# Patient Record
Sex: Female | Born: 1985 | Race: White | Hispanic: No | Marital: Married | Smoking: Never smoker
Health system: Southern US, Community
[De-identification: ages and names within clinical notes are randomized; demographics above are authoritative.]

## PROBLEM LIST (undated history)

## (undated) DIAGNOSIS — G8929 Other chronic pain: Secondary | ICD-10-CM

## (undated) HISTORY — DX: Other chronic pain: G89.29

## (undated) HISTORY — PX: BACK SURGERY: SHX140

---

## 2011-12-10 ENCOUNTER — Other Ambulatory Visit (HOSPITAL_COMMUNITY)
Admission: RE | Admit: 2011-12-10 | Discharge: 2011-12-10 | Disposition: A | Discharge status: Home / Self Care | Payer: BC Managed Care – PPO | Source: Ambulatory Visit | Attending: Family | Admitting: Family

## 2011-12-10 ENCOUNTER — Encounter: Payer: Self-pay | Admitting: Family

## 2011-12-10 ENCOUNTER — Ambulatory Visit (INDEPENDENT_AMBULATORY_CARE_PROVIDER_SITE_OTHER): Payer: BC Managed Care – PPO | Admitting: Family

## 2011-12-10 DIAGNOSIS — Z01419 Encounter for gynecological examination (general) (routine) without abnormal findings: Secondary | ICD-10-CM | POA: Insufficient documentation

## 2011-12-10 DIAGNOSIS — Z0000 Encounter for general adult medical examination without abnormal findings: Secondary | ICD-10-CM

## 2011-12-10 DIAGNOSIS — Z124 Encounter for screening for malignant neoplasm of cervix: Secondary | ICD-10-CM

## 2011-12-10 DIAGNOSIS — Z3009 Encounter for other general counseling and advice on contraception: Secondary | ICD-10-CM

## 2011-12-10 LAB — LIPID PANEL
HDL: 37.2 mg/dL — ABNORMAL LOW (ref >39.00)
Total CHOL/HDL Ratio: 5
Triglycerides: 65 mg/dL (ref 0.0–149.0)

## 2011-12-10 LAB — CBC
MCHC: 33.6 g/dL (ref 30.0–36.0)
Platelets: 222 10*3/uL (ref 150.0–400.0)
RBC: 4.32 Mil/uL (ref 3.87–5.11)
WBC: 5.4 10*3/uL (ref 4.5–10.5)

## 2011-12-10 LAB — BASIC METABOLIC PANEL
Calcium: 9.1 mg/dL (ref 8.4–10.5)
GFR: 102.63 mL/min (ref >60.00)
Glucose, Bld: 78 mg/dL (ref 70–99)
Sodium: 139 mEq/L (ref 135–145)

## 2011-12-10 MED ORDER — ETONOGESTREL-ETHINYL ESTRADIOL 0.12-0.015 MG/24HR VA RING: VAGINAL_RING | VAGINAL | Status: DC

## 2011-12-10 NOTE — Progress Notes (Signed)
  Subjective:    Patient ID: Natalie [name], female    DOB: [birth year], 26 y.o.   MRN: [id number]  HPI Comments: Routine CPE. Last pap March 2012, normal. Fasting. Requesting labs and pap. This is a routine physical examination for this healthy  Female. Reviewed all health maintenance protocols. Her immunization history was reviewed as well as her current medications and allergies refills of her chronic medications were given and the plan for yearly health maintenance was discussed all orders and referrals were made as appropriate.   Review of Systems  Constitutional: Negative.   HENT: Negative.   Eyes: Negative.   Respiratory: Negative.   Cardiovascular: Negative.   Gastrointestinal: Negative.   Genitourinary: Negative.   Musculoskeletal: Negative.   Skin: Negative.   Neurological: Negative.   Psychiatric/Behavioral: Negative.    No past medical history on file.  History   Social History  . Marital Status: Married    Spouse Name: N/A    Number of Children: N/A  . Years of Education: N/A   Occupational History  . Not on file.   Social History Main Topics  . Smoking status: Never Smoker   . Smokeless tobacco: Not on file  . Alcohol Use: Yes     occassionally  . Drug Use: No  . Sexually Active: Not on file   Other Topics Concern  . Not on file   Social History Narrative  . No narrative on file    No past surgical history on file.  Family History  Problem Relation Age of Onset  . Cancer Maternal Aunt     breast    Not on File  No current outpatient prescriptions on file prior to visit.    BP 106/70  Ht 5' 4.25" (1.632 m)  Wt 171 lb (77.565 kg)  BMI 29.12 kg/m2chart    Objective:   Physical Exam  Constitutional: She is oriented to person, place, and time. She appears well-developed and well-nourished. No distress.  HENT:  Right Ear: External ear normal.  Left Ear: External ear normal.  Nose: Nose normal.  Mouth/Throat: Oropharynx is clear and moist. No  oropharyngeal exudate.  Neck: Normal range of motion. Neck supple. No JVD present. No tracheal deviation present. No thyromegaly present.  Cardiovascular: Normal rate, regular rhythm, normal heart sounds and intact distal pulses.  Exam reveals no gallop and no friction rub.   No murmur heard. Pulmonary/Chest: Effort normal and breath sounds normal. No stridor. No respiratory distress. She has no wheezes. She has no rales. She exhibits no tenderness.  Abdominal: Soft. Bowel sounds are normal. She exhibits no distension and no mass. There is no tenderness. There is no rebound and no guarding.  Genitourinary: Vagina normal and uterus normal. No vaginal discharge found.  Musculoskeletal: Normal range of motion. She exhibits no tenderness.  Neurological: She is alert and oriented to person, place, and time. Coordination normal.  Skin: Skin is warm and dry. No rash noted. She is not diaphoretic. No erythema. No pallor.  Psychiatric: She has a normal mood and affect.          Assessment & Plan:  Assessment: CPE, PAP, Counseling on Birth Control  Plan: Labs: CBC, TSH, BMP. Teaching handout provided for heart healthy diet, safety, exercise Nuva ring

## 2011-12-10 NOTE — Patient Instructions (Signed)
Exercise to Stay Healthy Exercise helps you become and stay healthy. EXERCISE IDEAS AND TIPS Choose exercises that:  You enjoy.   Fit into your day.  You do not need to exercise really hard to be healthy. You can do exercises at a slow or medium level and stay healthy. You can:  Stretch before and after working out.   Try yoga, Pilates, or tai chi.   Lift weights.   Walk fast, swim, jog, run, climb stairs, bicycle, dance, or rollerskate.   Take aerobic classes.  Exercises that burn about 150 calories:  Running 1  miles in 15 minutes.   Playing volleyball for 45 to 60 minutes.   Washing and waxing a car for 45 to 60 minutes.   Playing touch football for 45 minutes.   Walking 1  miles in 35 minutes.   Pushing a stroller 1  miles in 30 minutes.   Playing basketball for 30 minutes.   Raking leaves for 30 minutes.   Bicycling 5 miles in 30 minutes.   Walking 2 miles in 30 minutes.   Dancing for 30 minutes.   Shoveling snow for 15 minutes.   Swimming laps for 20 minutes.   Walking up stairs for 15 minutes.   Bicycling 4 miles in 15 minutes.   Gardening for 30 to 45 minutes.   Jumping rope for 15 minutes.   Washing windows or floors for 45 to 60 minutes.  Document Released: 10/31/2010 Document Revised: 06/10/2011 Document Reviewed: 10/31/2010 [employer] Patient Information 2012 [employer]. 

## 2012-02-25 ENCOUNTER — Encounter: Payer: Self-pay | Admitting: Family

## 2012-02-25 ENCOUNTER — Ambulatory Visit (INDEPENDENT_AMBULATORY_CARE_PROVIDER_SITE_OTHER): Payer: BC Managed Care – PPO | Admitting: Family

## 2012-02-25 VITALS — BP 110/60 | Temp 99.0°F | Wt 171.0 lb

## 2012-02-25 DIAGNOSIS — F411 Generalized anxiety disorder: Secondary | ICD-10-CM

## 2012-02-25 DIAGNOSIS — F419 Anxiety disorder, unspecified: Secondary | ICD-10-CM

## 2012-02-25 DIAGNOSIS — R51 Headache: Secondary | ICD-10-CM

## 2012-02-25 MED ORDER — VENLAFAXINE HCL ER 75 MG PO CP24: 75.0000 mg | ORAL_CAPSULE | Freq: Every day | ORAL | Status: DC

## 2012-02-25 NOTE — Progress Notes (Signed)
  Subjective:    Patient ID: Natalie [name], female    DOB: 1986-08-20, 26 y.o.   MRN: [id number]  HPI 26 year old white female, nonsmoker is in today with complaints of anxiety and headaches. Patient was originally prescribed Effexor 150 mg but has not taken any medication in over a year. She's began to experience some anxiety and headaches. Patient recently relocated here. Her grandfather also recently died. Denies any depression. She denies any feelings of helplessness, hopelessness, both at death or dying.   Review of Systems  Constitutional: Negative.   Respiratory: Negative.   Cardiovascular: Negative.   Gastrointestinal: Negative.   Genitourinary: Negative.   Musculoskeletal: Negative.   Skin: Negative.   Neurological: Negative.   Hematological: Negative.   Psychiatric/Behavioral: Negative.    No past medical history on file.  History   Social History  . Marital Status: Married    Spouse Name: N/A    Number of Children: N/A  . Years of Education: N/A   Occupational History  . Not on file.   Social History Main Topics  . Smoking status: Never Smoker   . Smokeless tobacco: Not on file  . Alcohol Use: Yes     occassionally  . Drug Use: No  . Sexually Active: Not on file   Other Topics Concern  . Not on file   Social History Narrative  . No narrative on file    No past surgical history on file.  Family History  Problem Relation Age of Onset  . Cancer Maternal Aunt     breast    No Known Allergies  Current Outpatient Prescriptions on File Prior to Visit  Medication Sig Dispense Refill  . etonogestrel-ethinyl estradiol (NUVARING) 0.12-0.015 MG/24HR vaginal ring Insert vaginally and leave in place for 3 consecutive weeks, then remove for 1 week.  1 each  12  . venlafaxine XR (EFFEXOR XR) 75 MG 24 hr capsule Take 1 capsule (75 mg total) by mouth daily.  30 capsule  3    BP 110/60  Temp(Src) 99 F (37.2 C) (Oral)  Wt 171 lb (77.565 kg)chart    Objective:     Physical Exam  Constitutional: She is oriented to person, place, and time. She appears well-developed and well-nourished.  Neck: Normal range of motion. Neck supple.  Cardiovascular: Normal rate, regular rhythm and normal heart sounds.   Pulmonary/Chest: Effort normal and breath sounds normal.  Neurological: She is alert and oriented to person, place, and time.  Skin: Skin is warm and dry.  Psychiatric: She has a normal mood and affect.          Assessment & Plan:  Assessment: Anxiety, headaches  Plan: I believe her headaches are anxiety induced. We'll start Effexor XR 75 mg once daily. Will refer back for recheck in 3 weeks. We will consider increasing to 250 mg at her next office visit. Exercise 30 minutes a day.

## 2012-02-25 NOTE — Patient Instructions (Signed)

## 2012-03-29 ENCOUNTER — Ambulatory Visit: Payer: BC Managed Care – PPO | Admitting: Family

## 2012-03-31 ENCOUNTER — Ambulatory Visit: Payer: BC Managed Care – PPO | Admitting: Family

## 2012-04-07 ENCOUNTER — Ambulatory Visit (INDEPENDENT_AMBULATORY_CARE_PROVIDER_SITE_OTHER): Payer: BC Managed Care – PPO | Admitting: Family

## 2012-04-07 ENCOUNTER — Encounter: Payer: Self-pay | Admitting: Family

## 2012-04-07 VITALS — BP 110/72 | Temp 98.7°F | Wt 169.0 lb

## 2012-04-07 DIAGNOSIS — F419 Anxiety disorder, unspecified: Secondary | ICD-10-CM

## 2012-04-07 DIAGNOSIS — F411 Generalized anxiety disorder: Secondary | ICD-10-CM

## 2012-04-07 MED ORDER — VENLAFAXINE HCL ER 75 MG PO CP24: 75.0000 mg | ORAL_CAPSULE | Freq: Every day | ORAL | Status: DC

## 2012-04-07 NOTE — Progress Notes (Signed)
  Subjective:    Patient ID: Natalie [name], female    DOB: Aug 06, 1986, 26 y.o.   MRN: [id number]  HPI 26 year old white female, nonsmoker is in for recheck of anxiety. She's currently taking Effexor 75 mg once daily and tolerating the medication well. Her anxiety level had decreased and she feels less stress. She is adjusting well to [place]. She denies any feelings of helplessness, hopelessness, thoughts of death or dying.   Review of Systems  Constitutional: Negative.   Respiratory: Negative.   Cardiovascular: Negative.   Neurological: Negative.   Psychiatric/Behavioral: Negative.    No past medical history on file.  History   Social History  . Marital Status: Married    Spouse Name: N/A    Number of Children: N/A  . Years of Education: N/A   Occupational History  . Not on file.   Social History Main Topics  . Smoking status: Never Smoker   . Smokeless tobacco: Not on file  . Alcohol Use: Yes     occassionally  . Drug Use: No  . Sexually Active: Not on file   Other Topics Concern  . Not on file   Social History Narrative  . No narrative on file    No past surgical history on file.  Family History  Problem Relation Age of Onset  . Cancer Maternal Aunt     breast    No Known Allergies  Current Outpatient Prescriptions on File Prior to Visit  Medication Sig Dispense Refill  . etonogestrel-ethinyl estradiol (NUVARING) 0.12-0.015 MG/24HR vaginal ring Insert vaginally and leave in place for 3 consecutive weeks, then remove for 1 week.  1 each  12  . DISCONTD: venlafaxine XR (EFFEXOR XR) 75 MG 24 hr capsule Take 1 capsule (75 mg total) by mouth daily.  30 capsule  3    BP 110/72  Temp 98.7 F (37.1 C) (Oral)  Wt 169 lb (76.658 kg)chart    Objective:   Physical Exam  Constitutional: She is oriented to person, place, and time. She appears well-developed and well-nourished.  Neck: Normal range of motion. Neck supple.  Cardiovascular: Normal rate, regular  rhythm and normal heart sounds.   Pulmonary/Chest: Effort normal and breath sounds normal.  Musculoskeletal: Normal range of motion.  Neurological: She is alert and oriented to person, place, and time.  Skin: Skin is warm and dry.  Psychiatric: She has a normal mood and affect.          Assessment & Plan:  Assessment: Anxiety  Plan: Continue Effexor 75 mg once daily. Exercise daily. Recheck in a month for complete physical exam and sooner when necessary. Advise flu shots in late September early October.

## 2012-07-19 ENCOUNTER — Ambulatory Visit: Payer: BC Managed Care – PPO | Admitting: Family

## 2012-07-20 ENCOUNTER — Ambulatory Visit (INDEPENDENT_AMBULATORY_CARE_PROVIDER_SITE_OTHER): Payer: BC Managed Care – PPO | Admitting: Family

## 2012-07-20 ENCOUNTER — Encounter: Payer: Self-pay | Admitting: Family

## 2012-07-20 VITALS — BP 116/82 | HR 112 | Wt 167.0 lb

## 2012-07-20 DIAGNOSIS — F3289 Other specified depressive episodes: Secondary | ICD-10-CM

## 2012-07-20 DIAGNOSIS — F32A Depression, unspecified: Secondary | ICD-10-CM

## 2012-07-20 DIAGNOSIS — M545 Low back pain, unspecified: Secondary | ICD-10-CM

## 2012-07-20 DIAGNOSIS — F329 Major depressive disorder, single episode, unspecified: Secondary | ICD-10-CM

## 2012-07-20 DIAGNOSIS — M79604 Pain in right leg: Secondary | ICD-10-CM

## 2012-07-20 DIAGNOSIS — M79605 Pain in left leg: Secondary | ICD-10-CM

## 2012-07-20 DIAGNOSIS — Z23 Encounter for immunization: Secondary | ICD-10-CM

## 2012-07-20 MED ORDER — KETOROLAC TROMETHAMINE 60 MG/2ML IM SOLN
60.0000 mg | Freq: Once | INTRAMUSCULAR | Status: AC
Start: 2012-07-20 — End: 2012-07-20
  Administered 2012-07-20: 60 mg via INTRAMUSCULAR

## 2012-07-20 MED ORDER — DICLOFENAC SODIUM 75 MG PO TBEC: 75.0000 mg | DELAYED_RELEASE_TABLET | Freq: Two times a day (BID) | ORAL | Status: DC

## 2012-07-20 MED ORDER — CYCLOBENZAPRINE HCL 10 MG PO TABS: 10.0000 mg | ORAL_TABLET | Freq: Three times a day (TID) | ORAL | Status: DC | PRN
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
## 2012-07-20 NOTE — Patient Instructions (Signed)
Sciatica Sciatica is pain, weakness, numbness, or tingling along the path of the sciatic nerve. The nerve starts in the lower back and runs down the back of each leg. The nerve controls the muscles in the lower leg and in the back of the knee, while also providing sensation to the back of the thigh, lower leg, and the sole of your foot. Sciatica is a symptom of another medical condition. For instance, nerve damage or certain conditions, such as a herniated disk or bone spur on the spine, pinch or put pressure on the sciatic nerve. This causes the pain, weakness, or other sensations normally associated with sciatica. Generally, sciatica only affects one side of the body. CAUSES   Herniated or slipped disc.  Degenerative disk disease.  A pain disorder involving the narrow muscle in the buttocks (piriformis syndrome).  Pelvic injury or fracture.  Pregnancy.  Tumor (rare). SYMPTOMS  Symptoms can vary from mild to very severe. The symptoms usually travel from the low back to the buttocks and down the back of the leg. Symptoms can include:  Mild tingling or dull aches in the lower back, leg, or hip.  Numbness in the back of the calf or sole of the foot.  Burning sensations in the lower back, leg, or hip.  Sharp pains in the lower back, leg, or hip.  Leg weakness.  Severe back pain inhibiting movement. These symptoms may get worse with coughing, sneezing, laughing, or prolonged sitting or standing. Also, being overweight may worsen symptoms. DIAGNOSIS  Your caregiver will perform a physical exam to look for common symptoms of sciatica. He or she may ask you to do certain movements or activities that would trigger sciatic nerve pain. Other tests may be performed to find the cause of the sciatica. These may include:  Blood tests.  X-rays.  Imaging tests, such as an MRI or CT scan. TREATMENT  Treatment is directed at the cause of the sciatic pain. Sometimes, treatment is not necessary  and the pain and discomfort goes away on its own. If treatment is needed, your caregiver may suggest:  Over-the-counter medicines to relieve pain.  Prescription medicines, such as anti-inflammatory medicine, muscle relaxants, or narcotics.  Applying heat or ice to the painful area.  Steroid injections to lessen pain, irritation, and inflammation around the nerve.  Reducing activity during periods of pain.  Exercising and stretching to strengthen your abdomen and improve flexibility of your spine. Your caregiver may suggest losing weight if the extra weight makes the back pain worse.  Physical therapy.  Surgery to eliminate what is pressing or pinching the nerve, such as a bone spur or part of a herniated disk. HOME CARE INSTRUCTIONS   Only take over-the-counter or prescription medicines for pain or discomfort as directed by your caregiver.  Apply ice to the affected area for 20 minutes, 3 4 times a day for the first 48 72 hours. Then try heat in the same way.  Exercise, stretch, or perform your usual activities if these do not aggravate your pain.  Attend physical therapy sessions as directed by your caregiver.  Keep all follow-up appointments as directed by your caregiver.  Do not wear high heels or shoes that do not provide proper support.  Check your mattress to see if it is too soft. A firm mattress may lessen your pain and discomfort. SEEK IMMEDIATE MEDICAL CARE IF:   You lose control of your bowel or bladder (incontinence).  You have increasing weakness in the lower back,   pelvis, buttocks, or legs.  You have redness or swelling of your back.  You have a burning sensation when you urinate.  You have pain that gets worse when you lie down or awakens you at night.  Your pain is worse than you have experienced in the past.  Your pain is lasting longer than 4 weeks.  You are suddenly losing weight without reason. MAKE SURE YOU:  Understand these  instructions.  Will watch your condition.  Will get help right away if you are not doing well or get worse. Document Released: 09/22/2001 Document Revised: 03/29/2012 Document Reviewed: 02/07/2012 [employer] Patient Information 2013 [employer].  

## 2012-09-16 ENCOUNTER — Telehealth: Payer: Self-pay | Admitting: Family

## 2012-09-16 NOTE — Telephone Encounter (Signed)
Patient Information:  Caller Name: Yana  Phone: [phone]  Patient: Natalie [name], Natalie [name]  Gender: Female  DOB: 1986-09-21  Age: 26 Years  PCP: Adline Mango [facility])  Pregnant: No   Symptoms  Reason For Call & Symptoms: Seen 07/20/12  for hip pain radiating down the leg.  Told it was sciatica, and given shot in the hip.  Did not improve a lot, and so patient has been seeing the chiropractor, but hip pain has not improved.  Reviewed Health History In EMR: Yes  Reviewed Medications In EMR: Yes  Reviewed Allergies In EMR: Yes  Reviewed Surgeries / Procedures: Yes  Date of Onset of Symptoms: Unknown OB:  LMP: Unknown  Guideline(s) Used:  Back Pain  Disposition Per Guideline:   See Today or Tomorrow in Office  Reason For Disposition Reached:   Pain radiates into the thigh or further down the leg  Advice Given:  N/A  Office Follow Up:  Does the office need to follow up with this patient?: No  Instructions For The Office: N/A  Appointment Scheduled:  09/17/2012 09:00:00 Appointment Scheduled Provider:  Adline Mango (Family Practice)  RN Note:  L hip pain radiating down back of left thigh and down to foot.  Numbness and tingling present as well.  States left calf pain is severe at times.  Treated with injection in the hip and muscle relaxants with no change in pain.  Seen by chiropractor x 2 months with no change in pain as well.  Per protocol, advised appt within 24 hours; appt scheduled 09/17/12 0900 with Ms. Orvan Falconer at [place] office.  krs/can

## 2012-09-17 ENCOUNTER — Ambulatory Visit (INDEPENDENT_AMBULATORY_CARE_PROVIDER_SITE_OTHER): Payer: BC Managed Care – PPO | Admitting: Family

## 2012-09-17 ENCOUNTER — Encounter: Payer: Self-pay | Admitting: Family

## 2012-09-17 VITALS — BP 120/72 | HR 90 | Temp 97.1°F | Resp 16 | Wt 166.2 lb

## 2012-09-17 DIAGNOSIS — IMO0002 Reserved for concepts with insufficient information to code with codable children: Secondary | ICD-10-CM

## 2012-09-17 DIAGNOSIS — M545 Low back pain, unspecified: Secondary | ICD-10-CM

## 2012-09-17 DIAGNOSIS — M5416 Radiculopathy, lumbar region: Secondary | ICD-10-CM

## 2012-09-17 MED ORDER — HYDROCODONE-ACETAMINOPHEN 5-500 MG PO TABS: 1.0000 | ORAL_TABLET | Freq: Three times a day (TID) | ORAL | Status: DC | PRN

## 2012-09-17 MED ORDER — PREDNISONE 20 MG PO TABS: ORAL_TABLET | ORAL | Status: DC

## 2012-09-17 NOTE — Patient Instructions (Signed)
Sciatica Sciatica is pain, weakness, numbness, or tingling along the path of the sciatic nerve. The nerve starts in the lower back and runs down the back of each leg. The nerve controls the muscles in the lower leg and in the back of the knee, while also providing sensation to the back of the thigh, lower leg, and the sole of your foot. Sciatica is a symptom of another medical condition. For instance, nerve damage or certain conditions, such as a herniated disk or bone spur on the spine, pinch or put pressure on the sciatic nerve. This causes the pain, weakness, or other sensations normally associated with sciatica. Generally, sciatica only affects one side of the body. CAUSES   Herniated or slipped disc.  Degenerative disk disease.  A pain disorder involving the narrow muscle in the buttocks (piriformis syndrome).  Pelvic injury or fracture.  Pregnancy.  Tumor (rare). SYMPTOMS  Symptoms can vary from mild to very severe. The symptoms usually travel from the low back to the buttocks and down the back of the leg. Symptoms can include:  Mild tingling or dull aches in the lower back, leg, or hip.  Numbness in the back of the calf or sole of the foot.  Burning sensations in the lower back, leg, or hip.  Sharp pains in the lower back, leg, or hip.  Leg weakness.  Severe back pain inhibiting movement. These symptoms may get worse with coughing, sneezing, laughing, or prolonged sitting or standing. Also, being overweight may worsen symptoms. DIAGNOSIS  Your caregiver will perform a physical exam to look for common symptoms of sciatica. He or she may ask you to do certain movements or activities that would trigger sciatic nerve pain. Other tests may be performed to find the cause of the sciatica. These may include:  Blood tests.  X-rays.  Imaging tests, such as an MRI or CT scan. TREATMENT  Treatment is directed at the cause of the sciatic pain. Sometimes, treatment is not necessary  and the pain and discomfort goes away on its own. If treatment is needed, your caregiver may suggest:  Over-the-counter medicines to relieve pain.  Prescription medicines, such as anti-inflammatory medicine, muscle relaxants, or narcotics.  Applying heat or ice to the painful area.  Steroid injections to lessen pain, irritation, and inflammation around the nerve.  Reducing activity during periods of pain.  Exercising and stretching to strengthen your abdomen and improve flexibility of your spine. Your caregiver may suggest losing weight if the extra weight makes the back pain worse.  Physical therapy.  Surgery to eliminate what is pressing or pinching the nerve, such as a bone spur or part of a herniated disk. HOME CARE INSTRUCTIONS   Only take over-the-counter or prescription medicines for pain or discomfort as directed by your caregiver.  Apply ice to the affected area for 20 minutes, 3 4 times a day for the first 48 72 hours. Then try heat in the same way.  Exercise, stretch, or perform your usual activities if these do not aggravate your pain.  Attend physical therapy sessions as directed by your caregiver.  Keep all follow-up appointments as directed by your caregiver.  Do not wear high heels or shoes that do not provide proper support.  Check your mattress to see if it is too soft. A firm mattress may lessen your pain and discomfort. SEEK IMMEDIATE MEDICAL CARE IF:   You lose control of your bowel or bladder (incontinence).  You have increasing weakness in the lower back,   pelvis, buttocks, or legs.  You have redness or swelling of your back.  You have a burning sensation when you urinate.  You have pain that gets worse when you lie down or awakens you at night.  Your pain is worse than you have experienced in the past.  Your pain is lasting longer than 4 weeks.  You are suddenly losing weight without reason. MAKE SURE YOU:  Understand these  instructions.  Will watch your condition.  Will get help right away if you are not doing well or get worse. Document Released: 09/22/2001 Document Revised: 03/29/2012 Document Reviewed: 02/07/2012 [employer] Patient Information 2013 [employer].  

## 2012-09-17 NOTE — Progress Notes (Signed)
Subjective:    Patient ID: Natalie [name], female    DOB: [birth year], 26 y.o.   MRN: [id number]  HPI 26 year old white female, nonsmoker, is in with persistent low back pain that radiates down her left leg. She has been on anti-inflammatory medications, muscle relaxers, seen a chiropractor over the last several months that has not helped. Rates the pain 5/10 and worsening. Pain is worse with walking and with movement. Ice has helped some. Denies any pmhx of back injury.    Review of Systems  Constitutional: Negative.   Respiratory: Negative.   Cardiovascular: Negative.   Gastrointestinal: Negative.   Musculoskeletal: Positive for back pain.       Radiates down the leg into the shin area  Neurological: Negative.  Negative for weakness and numbness.  Hematological: Negative.   Psychiatric/Behavioral: Negative.    No past medical history on file.  History   Social History  . Marital Status: Married    Spouse Name: N/A    Number of Children: N/A  . Years of Education: N/A   Occupational History  . Not on file.   Social History Main Topics  . Smoking status: Never Smoker   . Smokeless tobacco: Not on file  . Alcohol Use: Yes     Comment: occassionally  . Drug Use: No  . Sexually Active: Not on file   Other Topics Concern  . Not on file   Social History Narrative  . No narrative on file    No past surgical history on file.  Family History  Problem Relation Age of Onset  . Cancer Maternal Aunt     breast    No Known Allergies  Current Outpatient Prescriptions on File Prior to Visit  Medication Sig Dispense Refill  . cyclobenzaprine (FLEXERIL) 10 MG tablet Take 1 tablet (10 mg total) by mouth 3 (three) times daily as needed for muscle spasms.  30 tablet  0  . diclofenac (VOLTAREN) 75 MG EC tablet Take 1 tablet (75 mg total) by mouth 2 (two) times daily.  30 tablet  0  . etonogestrel-ethinyl estradiol (NUVARING) 0.12-0.015 MG/24HR vaginal ring Insert vaginally and  leave in place for 3 consecutive weeks, then remove for 1 week.  1 each  12  . venlafaxine XR (EFFEXOR XR) 75 MG 24 hr capsule Take 1 capsule (75 mg total) by mouth daily.  30 capsule  5    BP 120/72  Pulse 90  Temp 97.1 F (36.2 C) (Oral)  Resp 16  Wt 166 lb 4 oz (75.411 kg)chart    Objective:   Physical Exam  Constitutional: She is oriented to person, place, and time. She appears well-developed and well-nourished.  Neck: Normal range of motion. Neck supple.  Cardiovascular: Normal rate, regular rhythm and normal heart sounds.   Pulmonary/Chest: Effort normal and breath sounds normal.  Abdominal: Soft. Bowel sounds are normal.  Musculoskeletal: She exhibits no edema.       Pain wit flexion and extension at the hip. Has about a 45 degree hip flexion and pain is experienced. Positive SLR on the left. No tenderness to palpation of the spinosis process.   Neurological: She is alert and oriented to person, place, and time. She has normal reflexes. She displays normal reflexes. No cranial nerve deficit. She exhibits normal muscle tone. Coordination normal.  Skin: Skin is dry.  Psychiatric: She has a normal mood and affect.          Assessment & Plan:  Assessment: Lumbar  Radiculopathy, Low Back Pain  Plan: Prednisone 60mg  x 3, 40mg  x 3, 20mg  x 3. Vicodin as needed for pain. MRI of the lspine and possible referral to neuro-surg for further management. Patient to call the office if symptoms worsen or persist. Recheck as scheduled and sooner as needed.

## 2012-09-19 NOTE — Telephone Encounter (Signed)
No follow up required, closing encounter. °

## 2012-09-25 ENCOUNTER — Ambulatory Visit
Admission: RE | Admit: 2012-09-25 | Discharge: 2012-09-25 | Disposition: A | Discharge status: Home / Self Care | Payer: BC Managed Care – PPO | Source: Ambulatory Visit | Attending: Family | Admitting: Family

## 2012-09-25 DIAGNOSIS — M545 Low back pain, unspecified: Secondary | ICD-10-CM

## 2012-09-25 DIAGNOSIS — M5416 Radiculopathy, lumbar region: Secondary | ICD-10-CM

## 2012-09-26 ENCOUNTER — Other Ambulatory Visit: Payer: Self-pay | Admitting: Family

## 2012-09-26 DIAGNOSIS — M5136 Other intervertebral disc degeneration, lumbar region: Secondary | ICD-10-CM

## 2012-09-26 DIAGNOSIS — M5126 Other intervertebral disc displacement, lumbar region: Secondary | ICD-10-CM

## 2012-09-28 ENCOUNTER — Ambulatory Visit: Payer: BC Managed Care – PPO | Admitting: Family

## 2012-10-04 ENCOUNTER — Other Ambulatory Visit: Payer: Self-pay | Admitting: Neurosurgery

## 2012-10-04 DIAGNOSIS — M549 Dorsalgia, unspecified: Secondary | ICD-10-CM

## 2012-10-11 ENCOUNTER — Ambulatory Visit
Admission: RE | Admit: 2012-10-11 | Discharge: 2012-10-11 | Disposition: A | Discharge status: Home / Self Care | Payer: BC Managed Care – PPO | Source: Ambulatory Visit | Attending: Neurosurgery | Admitting: Neurosurgery

## 2012-10-11 DIAGNOSIS — M549 Dorsalgia, unspecified: Secondary | ICD-10-CM

## 2012-10-11 MED ORDER — IOHEXOL 180 MG/ML  SOLN
1.0000 mL | Freq: Once | INTRAMUSCULAR | Status: AC | PRN
  Administered 2012-10-11: 1 mL via EPIDURAL

## 2012-10-11 MED ORDER — METHYLPREDNISOLONE ACETATE 40 MG/ML INJ SUSP (RADIOLOG
120.0000 mg | Freq: Once | INTRAMUSCULAR | Status: AC
  Administered 2012-10-11: 120 mg via EPIDURAL

## 2012-10-19 ENCOUNTER — Other Ambulatory Visit: Payer: Self-pay

## 2012-10-19 MED ORDER — ETONOGESTREL-ETHINYL ESTRADIOL 0.12-0.015 MG/24HR VA RING: VAGINAL_RING | VAGINAL | Status: DC

## 2012-10-28 ENCOUNTER — Other Ambulatory Visit: Payer: Self-pay

## 2012-10-28 MED ORDER — VENLAFAXINE HCL ER 75 MG PO CP24: 75.0000 mg | ORAL_CAPSULE | Freq: Every day | ORAL | Status: DC

## 2012-12-05 ENCOUNTER — Ambulatory Visit (INDEPENDENT_AMBULATORY_CARE_PROVIDER_SITE_OTHER): Payer: PRIVATE HEALTH INSURANCE | Admitting: Family Medicine

## 2012-12-05 ENCOUNTER — Other Ambulatory Visit (INDEPENDENT_AMBULATORY_CARE_PROVIDER_SITE_OTHER): Payer: PRIVATE HEALTH INSURANCE

## 2012-12-05 ENCOUNTER — Encounter: Payer: Self-pay | Admitting: Family Medicine

## 2012-12-05 VITALS — BP 104/68 | HR 131 | Temp 98.3°F | Wt 170.0 lb

## 2012-12-05 DIAGNOSIS — J069 Acute upper respiratory infection, unspecified: Secondary | ICD-10-CM

## 2012-12-05 DIAGNOSIS — J209 Acute bronchitis, unspecified: Secondary | ICD-10-CM

## 2012-12-05 DIAGNOSIS — Z0000 Encounter for general adult medical examination without abnormal findings: Secondary | ICD-10-CM

## 2012-12-05 LAB — HEPATIC FUNCTION PANEL: Total Bilirubin: 0.2 mg/dL — ABNORMAL LOW (ref 0.3–1.2)

## 2012-12-05 LAB — CBC WITH DIFFERENTIAL/PLATELET
Basophils Absolute: 0 10*3/uL (ref 0.0–0.1)
HCT: 36.2 % (ref 36.0–46.0)
Lymphs Abs: 1.6 10*3/uL (ref 0.7–4.0)
MCV: 93.5 fl (ref 78.0–100.0)
Monocytes Absolute: 0.6 10*3/uL (ref 0.1–1.0)
Monocytes Relative: 6.3 % (ref 3.0–12.0)
Platelets: 271 10*3/uL (ref 150.0–400.0)
RDW: 12.3 % (ref 11.5–14.6)

## 2012-12-05 LAB — BASIC METABOLIC PANEL
BUN: 8 mg/dL (ref 6–23)
GFR: 92.91 mL/min (ref >60.00)
Glucose, Bld: 83 mg/dL (ref 70–99)
Potassium: 3.7 mEq/L (ref 3.5–5.1)

## 2012-12-05 LAB — LIPID PANEL
HDL: 38.3 mg/dL — ABNORMAL LOW (ref >39.00)
Triglycerides: 98 mg/dL (ref 0.0–149.0)

## 2012-12-05 LAB — TSH: TSH: 1.45 u[IU]/mL (ref 0.35–5.50)

## 2012-12-05 MED ORDER — BENZONATATE 100 MG PO CAPS: 100.0000 mg | ORAL_CAPSULE | Freq: Three times a day (TID) | ORAL | Status: DC | PRN

## 2012-12-05 MED ORDER — AZITHROMYCIN 250 MG PO TABS: ORAL_TABLET | ORAL | Status: DC

## 2012-12-05 MED ORDER — ALBUTEROL SULFATE HFA 108 (90 BASE) MCG/ACT IN AERS: 2.0000 | INHALATION_SPRAY | Freq: Four times a day (QID) | RESPIRATORY_TRACT | Status: DC | PRN

## 2012-12-05 NOTE — Progress Notes (Signed)
Chief Complaint  Patient presents with  . back surgery x 3 weeks ago  . deep cough    hurts patient to couh; terrible pain     HPI:  Acute visit for cough: -started about 1 week ago -symptoms: productive cough, congestion, wheezing a little -denies: fevers, NVD, SOB, no exposure to flu -she had back surgery for a disc problem 3 days ago and cough is aggravating the post surgical back pain -no sick contacts  ROS: See pertinent positives and negatives per HPI.  No past medical history on file.  Family History  Problem Relation Age of Onset  . Cancer Maternal Aunt     breast    History   Social History  . Marital Status: Married    Spouse Name: N/A    Number of Children: N/A  . Years of Education: N/A   Social History Main Topics  . Smoking status: Never Smoker   . Smokeless tobacco: None  . Alcohol Use: Yes     Comment: occassionally  . Drug Use: No  . Sexually Active: None   Other Topics Concern  . None   Social History Narrative  . None    Current outpatient prescriptions:diazepam (VALIUM) 5 MG tablet, Take 5 mg by mouth every 6 (six) hours as needed., Disp: , Rfl: ;  etonogestrel-ethinyl estradiol (NUVARING) 0.12-0.015 MG/24HR vaginal ring, Insert vaginally and leave in place for 3 consecutive weeks, then remove for 1 week., Disp: 1 each, Rfl: 12 oxyCODONE (ROXICODONE) 15 MG immediate release tablet, Take 15 mg by mouth. Take 1/2 to 1 tablet by mouth every 4 to 6 hours as needed for pain., Disp: , Rfl: ;  venlafaxine XR (EFFEXOR XR) 75 MG 24 hr capsule, Take 1 capsule (75 mg total) by mouth daily., Disp: 30 capsule, Rfl: 5;  albuterol (PROVENTIL HFA;VENTOLIN HFA) 108 (90 BASE) MCG/ACT inhaler, Inhale 2 puffs into the lungs every 6 (six) hours as needed for wheezing., Disp: 1 Inhaler, Rfl: 0 azithromycin (ZITHROMAX) 250 MG tablet, 2 tabs on the first day then one tab daily, Disp: 6 tablet, Rfl: 0;  benzonatate (TESSALON PERLES) 100 MG capsule, Take 1 capsule (100 mg  total) by mouth 3 (three) times daily as needed for cough., Disp: 20 capsule, Rfl: 0;  cyclobenzaprine (FLEXERIL) 10 MG tablet, Take 1 tablet (10 mg total) by mouth 3 (three) times daily as needed for muscle spasms., Disp: 30 tablet, Rfl: 0 diclofenac (VOLTAREN) 75 MG EC tablet, Take 1 tablet (75 mg total) by mouth 2 (two) times daily., Disp: 30 tablet, Rfl: 0;  HYDROcodone-acetaminophen (VICODIN) 5-500 MG per tablet, Take 1 tablet by mouth every 8 (eight) hours as needed for pain., Disp: 20 tablet, Rfl: 0;  predniSONE (DELTASONE) 20 MG tablet, 60mg  PO qam x 3 days, 40mg  po qam x 3 days, 20mg  qam x 3 days, Disp: 18 tablet, Rfl: 0  EXAM:  Filed Vitals:   12/05/12 0843  BP: 104/68  Pulse: 131  Temp: 98.3 F (36.8 C)    Body mass index is 28.95 kg/(m^2).  GENERAL: vitals reviewed and listed above, alert, oriented, appears well hydrated and in no acute distress  HEENT: atraumatic, conjunttiva clear, no obvious abnormalities on inspection of external nose and ears, normal appearance of ear canals and TMs, clear nasal congestion, mild post oropharyngeal erythema with PND, no tonsillar edema or exudate, no sinus TTP  NECK: no obvious masses on inspection  LUNGS: few scattered wheezes, no rhonchi  CV: HRRR, no peripheral edema  MS: moves all extremities without noticeable abnormality  PSYCH: pleasant and cooperative, no obvious depression or anxiety  ASSESSMENT AND PLAN:  Discussed the following assessment and plan:  Acute bronchitis - Plan: azithromycin (ZITHROMAX) 250 MG tablet, albuterol (PROVENTIL HFA;VENTOLIN HFA) 108 (90 BASE) MCG/ACT inhaler, benzonatate (TESSALON PERLES) 100 MG capsule  Acute upper respiratory infections of unspecified site - Plan: albuterol (PROVENTIL HFA;VENTOLIN HFA) 108 (90 BASE) MCG/ACT inhaler, benzonatate (TESSALON PERLES) 100 MG capsule  -likely viral, but given exam findings, getting worse over one week will tx with abx, alb and cough medication. She is  taking oxycodone from her [facility] for pain. Advised if continued or worsening back pain should see her neurosurgeon. Has follow up with PCP next week. -Patient advised to return or notify a doctor immediately if symptoms worsen or persist or new concerns arise.  Patient Instructions  -As we discussed, we have prescribed a new medication (Azithromycin, tessalon perles and albuterol) for you at this appointment. We discussed the common and serious potential adverse effects of this medication and you can review these and more with the pharmacist when you pick up your medication.  Please follow the instructions for use carefully and notify us immediately if you have any problems taking this medication.  -humidifier at night  -follow up with your neurosurgeon if continued back pain  -follow with your doctor next week or sooner if other concerns or worsening        Zebadiah Willert R.

## 2012-12-05 NOTE — Addendum Note (Signed)
Addended by: Bonnye Fava on: 12/05/2012 08:37 AM   Modules accepted: Orders

## 2012-12-05 NOTE — Patient Instructions (Addendum)
-  As we discussed, we have prescribed a new medication (Azithromycin, tessalon perles and albuterol) for you at this appointment. We discussed the common and serious potential adverse effects of this medication and you can review these and more with the pharmacist when you pick up your medication.  Please follow the instructions for use carefully and notify us immediately if you have any problems taking this medication.  -humidifier at night  -follow up with your neurosurgeon if continued back pain  -follow with your doctor next week or sooner if other concerns or worsening

## 2012-12-09 ENCOUNTER — Other Ambulatory Visit (HOSPITAL_COMMUNITY): Payer: Self-pay | Admitting: Neurosurgery

## 2012-12-09 ENCOUNTER — Ambulatory Visit (HOSPITAL_COMMUNITY)
Admission: RE | Admit: 2012-12-09 | Discharge: 2012-12-09 | Disposition: A | Discharge status: Home / Self Care | Payer: PRIVATE HEALTH INSURANCE | Source: Ambulatory Visit | Attending: Neurosurgery | Admitting: Neurosurgery

## 2012-12-09 DIAGNOSIS — M545 Low back pain, unspecified: Secondary | ICD-10-CM | POA: Insufficient documentation

## 2012-12-09 DIAGNOSIS — M5126 Other intervertebral disc displacement, lumbar region: Secondary | ICD-10-CM | POA: Insufficient documentation

## 2012-12-09 DIAGNOSIS — M549 Dorsalgia, unspecified: Secondary | ICD-10-CM

## 2012-12-09 DIAGNOSIS — M79609 Pain in unspecified limb: Secondary | ICD-10-CM | POA: Insufficient documentation

## 2012-12-09 MED ORDER — GADOBENATE DIMEGLUMINE 529 MG/ML IV SOLN
15.0000 mL | Freq: Once | INTRAVENOUS | Status: AC | PRN
  Administered 2012-12-09: 15 mL via INTRAVENOUS

## 2012-12-12 ENCOUNTER — Encounter: Payer: Self-pay | Admitting: Family

## 2012-12-12 ENCOUNTER — Other Ambulatory Visit: Payer: Self-pay | Admitting: Neurosurgery

## 2012-12-12 ENCOUNTER — Ambulatory Visit (INDEPENDENT_AMBULATORY_CARE_PROVIDER_SITE_OTHER): Payer: PRIVATE HEALTH INSURANCE | Admitting: Family

## 2012-12-12 ENCOUNTER — Ambulatory Visit
Admission: RE | Admit: 2012-12-12 | Discharge: 2012-12-12 | Disposition: A | Discharge status: Home / Self Care | Payer: PRIVATE HEALTH INSURANCE | Source: Ambulatory Visit | Attending: Neurosurgery | Admitting: Neurosurgery

## 2012-12-12 VITALS — BP 112/74 | Wt 170.0 lb

## 2012-12-12 DIAGNOSIS — M549 Dorsalgia, unspecified: Secondary | ICD-10-CM

## 2012-12-12 DIAGNOSIS — R062 Wheezing: Secondary | ICD-10-CM

## 2012-12-12 DIAGNOSIS — J209 Acute bronchitis, unspecified: Secondary | ICD-10-CM

## 2012-12-12 DIAGNOSIS — Z0000 Encounter for general adult medical examination without abnormal findings: Secondary | ICD-10-CM

## 2012-12-12 LAB — POCT URINALYSIS DIPSTICK
Bilirubin, UA: NEGATIVE
Glucose, UA: NEGATIVE
Nitrite, UA: NEGATIVE

## 2012-12-12 MED ORDER — IOHEXOL 180 MG/ML  SOLN
1.0000 mL | Freq: Once | INTRAMUSCULAR | Status: AC | PRN
  Administered 2012-12-12: 1 mL via EPIDURAL

## 2012-12-12 MED ORDER — METHYLPREDNISOLONE ACETATE 40 MG/ML INJ SUSP (RADIOLOG
120.0000 mg | Freq: Once | INTRAMUSCULAR | Status: AC
  Administered 2012-12-12: 120 mg via EPIDURAL

## 2012-12-12 MED ORDER — PREDNISONE 20 MG PO TABS: ORAL_TABLET | ORAL | Status: AC

## 2012-12-12 NOTE — Patient Instructions (Addendum)

## 2012-12-12 NOTE — Progress Notes (Signed)
Subjective:    Patient ID: Natalie [name], female    DOB: Nov 10, 1985, 27 y.o.   MRN: [id number]  HPI 7 -year-old, nonsmoker is in today for complete physical exam. She is declining Pap smear today due to 2 persistent back pain. Patient is status post laminectomy in January and has been having complications since that point. Reports initially feeling well then about 2 weeks postop, she began to have extreme low back pain that she's been medicated with hydrocodone. She had an MRI ordered by neurosurgery on Friday. She has an appointment here today.  Patient continues to complain of cough and chest congestion that's been ongoing x2 weeks. She saw Dr. Selena Batten last week who prescribed a Z-Pak and an inhaler. Patient symptoms have not improved. Now has wheezing   Review of Systems  Constitutional: Negative.   HENT: Negative.   Eyes: Negative.   Respiratory: Positive for cough and wheezing. Negative for shortness of breath.   Cardiovascular: Negative.   Gastrointestinal: Negative.   Endocrine: Negative.   Genitourinary: Negative.   Musculoskeletal: Positive for back pain.  Skin: Negative.   Allergic/Immunologic: Negative.   Neurological: Negative.   Hematological: Negative.   Psychiatric/Behavioral: Negative.    History reviewed. No pertinent past medical history.  History   Social History  . Marital Status: Married    Spouse Name: N/A    Number of Children: N/A  . Years of Education: N/A   Occupational History  . Not on file.   Social History Main Topics  . Smoking status: Never Smoker   . Smokeless tobacco: Not on file  . Alcohol Use: Yes     Comment: occassionally  . Drug Use: No  . Sexually Active: Not on file   Other Topics Concern  . Not on file   Social History Narrative  . No narrative on file    History reviewed. No pertinent past surgical history.  Family History  Problem Relation Age of Onset  . Cancer Maternal Aunt     breast    No Known  Allergies  Current Outpatient Prescriptions on File Prior to Visit  Medication Sig Dispense Refill  . albuterol (PROVENTIL HFA;VENTOLIN HFA) 108 (90 BASE) MCG/ACT inhaler Inhale 2 puffs into the lungs every 6 (six) hours as needed for wheezing.  1 Inhaler  0  . azithromycin (ZITHROMAX) 250 MG tablet 2 tabs on the first day then one tab daily  6 tablet  0  . benzonatate (TESSALON PERLES) 100 MG capsule Take 1 capsule (100 mg total) by mouth 3 (three) times daily as needed for cough.  20 capsule  0  . cyclobenzaprine (FLEXERIL) 10 MG tablet Take 1 tablet (10 mg total) by mouth 3 (three) times daily as needed for muscle spasms.  30 tablet  0  . diazepam (VALIUM) 5 MG tablet Take 5 mg by mouth every 6 (six) hours as needed.      . diclofenac (VOLTAREN) 75 MG EC tablet Take 1 tablet (75 mg total) by mouth 2 (two) times daily.  30 tablet  0  . etonogestrel-ethinyl estradiol (NUVARING) 0.12-0.015 MG/24HR vaginal ring Insert vaginally and leave in place for 3 consecutive weeks, then remove for 1 week.  1 each  12  . HYDROcodone-acetaminophen (VICODIN) 5-500 MG per tablet Take 1 tablet by mouth every 8 (eight) hours as needed for pain.  20 tablet  0  . oxyCODONE (ROXICODONE) 15 MG immediate release tablet Take 15 mg by mouth. Take 1/2 to 1 tablet by  mouth every 4 to 6 hours as needed for pain.      Marland Kitchen venlafaxine XR (EFFEXOR XR) 75 MG 24 hr capsule Take 1 capsule (75 mg total) by mouth daily.  30 capsule  5   No current facility-administered medications on file prior to visit.    BP 112/74  Wt 170 lb (77.111 kg)  BMI 28.95 kg/m2  SpO2 97%  LMP 12/10/2013chart    Objective:   Physical Exam  Constitutional: She is oriented to person, place, and time. She appears well-developed and well-nourished.  HENT:  Right Ear: External ear normal.  Left Ear: External ear normal.  Nose: Nose normal.  Mouth/Throat: Oropharynx is clear and moist.  Eyes: Conjunctivae are normal. Pupils are equal, round, and  reactive to light.  Neck: Normal range of motion. Neck supple.  Cardiovascular: Normal rate, regular rhythm and normal heart sounds.   Pulmonary/Chest: Effort normal. She has wheezes.  Abdominal: Soft. Bowel sounds are normal. She exhibits no distension. There is no tenderness. There is no rebound.  Musculoskeletal: Normal range of motion.  Neurological: She is alert and oriented to person, place, and time. She has normal reflexes. No cranial nerve deficit.  Skin: Skin is warm and dry.  Psychiatric: She has a normal mood and affect.          Assessment & Plan:  Assessment:  1. Complete physical exam 2. Low back pain 3. Acute bronchitis  Plan: See neurosurgery as scheduled. Prednisone 60 x 3, 40 x 3, 20 x 3 for bronchitis. Labs discussed. Schedule pap smear when back pain is improved.

## 2012-12-28 ENCOUNTER — Other Ambulatory Visit (HOSPITAL_COMMUNITY)
Admission: RE | Admit: 2012-12-28 | Discharge: 2012-12-28 | Disposition: A | Discharge status: Home / Self Care | Payer: PRIVATE HEALTH INSURANCE | Source: Ambulatory Visit | Attending: Family | Admitting: Family

## 2012-12-28 ENCOUNTER — Ambulatory Visit (INDEPENDENT_AMBULATORY_CARE_PROVIDER_SITE_OTHER): Payer: PRIVATE HEALTH INSURANCE | Admitting: Family

## 2012-12-28 ENCOUNTER — Encounter: Payer: Self-pay | Admitting: Family

## 2012-12-28 VITALS — BP 100/62 | HR 122 | Wt 170.0 lb

## 2012-12-28 DIAGNOSIS — Z124 Encounter for screening for malignant neoplasm of cervix: Secondary | ICD-10-CM

## 2012-12-28 DIAGNOSIS — G8929 Other chronic pain: Secondary | ICD-10-CM

## 2012-12-28 DIAGNOSIS — M549 Dorsalgia, unspecified: Secondary | ICD-10-CM

## 2012-12-28 DIAGNOSIS — Z01419 Encounter for gynecological examination (general) (routine) without abnormal findings: Secondary | ICD-10-CM | POA: Insufficient documentation

## 2012-12-28 NOTE — Patient Instructions (Addendum)

## 2012-12-28 NOTE — Progress Notes (Signed)
Subjective:    Patient ID: Natalie [name], female    DOB: 1986/10/11, 27 y.o.   MRN: [id number]  HPI 27 year old WF, nonsmoker, is in today for a pap smear. She denies any concerns. She continues to see neurology for management and doing well.     Review of Systems  Constitutional: Negative.   Eyes: Negative.   Respiratory: Negative.   Cardiovascular: Negative.   Gastrointestinal: Negative.   Endocrine: Negative.   Musculoskeletal: Positive for back pain. Negative for myalgias, joint swelling and arthralgias.  Skin: Negative.   Neurological: Negative.  Negative for light-headedness.  Hematological: Negative.   Psychiatric/Behavioral: Negative.    No past medical history on file.  History   Social History  . Marital Status: Married    Spouse Name: N/A    Number of Children: N/A  . Years of Education: N/A   Occupational History  . Not on file.   Social History Main Topics  . Smoking status: Never Smoker   . Smokeless tobacco: Not on file  . Alcohol Use: Yes     Comment: occassionally  . Drug Use: No  . Sexually Active: Not on file   Other Topics Concern  . Not on file   Social History Narrative  . No narrative on file    No past surgical history on file.  Family History  Problem Relation Age of Onset  . Cancer Maternal Aunt     breast    No Known Allergies  Current Outpatient Prescriptions on File Prior to Visit  Medication Sig Dispense Refill  . albuterol (PROVENTIL HFA;VENTOLIN HFA) 108 (90 BASE) MCG/ACT inhaler Inhale 2 puffs into the lungs every 6 (six) hours as needed for wheezing.  1 Inhaler  0  . azithromycin (ZITHROMAX) 250 MG tablet 2 tabs on the first day then one tab daily  6 tablet  0  . benzonatate (TESSALON PERLES) 100 MG capsule Take 1 capsule (100 mg total) by mouth 3 (three) times daily as needed for cough.  20 capsule  0  . cyclobenzaprine (FLEXERIL) 10 MG tablet Take 1 tablet (10 mg total) by mouth 3 (three) times daily as needed for  muscle spasms.  30 tablet  0  . diclofenac (VOLTAREN) 75 MG EC tablet Take 1 tablet (75 mg total) by mouth 2 (two) times daily.  30 tablet  0  . etonogestrel-ethinyl estradiol (NUVARING) 0.12-0.015 MG/24HR vaginal ring Insert vaginally and leave in place for 3 consecutive weeks, then remove for 1 week.  1 each  12  . HYDROcodone-acetaminophen (VICODIN) 5-500 MG per tablet Take 1 tablet by mouth every 8 (eight) hours as needed for pain.  20 tablet  0  . venlafaxine XR (EFFEXOR XR) 75 MG 24 hr capsule Take 1 capsule (75 mg total) by mouth daily.  30 capsule  5  . diazepam (VALIUM) 5 MG tablet Take 5 mg by mouth every 6 (six) hours as needed.      Marland Kitchen oxyCODONE (ROXICODONE) 15 MG immediate release tablet Take 15 mg by mouth. Take 1/2 to 1 tablet by mouth every 4 to 6 hours as needed for pain.       No current facility-administered medications on file prior to visit.    BP 100/62  Pulse 122  Wt 170 lb (77.111 kg)  BMI 28.95 kg/m2  SpO2 97%  LMP 12/10/2013chart    Objective:   Physical Exam  Constitutional: She is oriented to person, place, and time. She appears well-developed and  well-nourished.  Neck: Normal range of motion. Neck supple.  Cardiovascular: Normal rate, regular rhythm and normal heart sounds.   Pulmonary/Chest: Effort normal and breath sounds normal.  Musculoskeletal: Normal range of motion. She exhibits tenderness. She exhibits no edema.  Neurological: She is alert and oriented to person, place, and time. She displays normal reflexes. No cranial nerve deficit. Coordination normal.  Skin: Skin is warm and dry.  Psychiatric: She has a normal mood and affect.          Assessment & Plan:  Assessment:  1. Chronic Low Back Pain   2. Pap Smear  Plan: Pap smear sent. Follow-up with neurology as scheduled and as needed.

## 2013-01-13 ENCOUNTER — Telehealth: Payer: Self-pay | Admitting: Family

## 2013-01-13 ENCOUNTER — Ambulatory Visit (INDEPENDENT_AMBULATORY_CARE_PROVIDER_SITE_OTHER): Payer: PRIVATE HEALTH INSURANCE | Admitting: Family Medicine

## 2013-01-13 ENCOUNTER — Encounter: Payer: Self-pay | Admitting: Family Medicine

## 2013-01-13 VITALS — BP 110/90 | HR 122 | Temp 98.9°F | Wt 168.0 lb

## 2013-01-13 DIAGNOSIS — M549 Dorsalgia, unspecified: Secondary | ICD-10-CM

## 2013-01-13 MED ORDER — HYDROCODONE-ACETAMINOPHEN 5-500 MG PO TABS: ORAL_TABLET | ORAL | Status: DC

## 2013-01-13 NOTE — Progress Notes (Signed)
Chief Complaint  Patient presents with  . Back Pain    HPI:  Acute visit for back pain: -had discectomy in January -was on narcotic pain medications several months - Vicodin at times and oxycodone 15 mg bid -back pain is much better and she stopped this med and is having withdrawal symptoms- shaky, cold, weird when tries to stop it which is scaring her -denies: fevers, urinary symptoms, cough, other symptoms suggesting infections -she tried to call her surgeon office and get a hold of him -wants to come of the narcotics, but wonders if needs a taper -wanted to just take tylenol  ROS: See pertinent positives and negatives per HPI.  No past medical history on file.  Family History  Problem Relation Age of Onset  . Cancer Maternal Aunt     breast    History   Social History  . Marital Status: Married    Spouse Name: N/A    Number of Children: N/A  . Years of Education: N/A   Social History Main Topics  . Smoking status: Never Smoker   . Smokeless tobacco: None  . Alcohol Use: Yes     Comment: occassionally  . Drug Use: No  . Sexually Active: None   Other Topics Concern  . None   Social History Narrative  . None    Current outpatient prescriptions:albuterol (PROVENTIL HFA;VENTOLIN HFA) 108 (90 BASE) MCG/ACT inhaler, Inhale 2 puffs into the lungs every 6 (six) hours as needed for wheezing., Disp: 1 Inhaler, Rfl: 0;  cyclobenzaprine (FLEXERIL) 10 MG tablet, Take 1 tablet (10 mg total) by mouth 3 (three) times daily as needed for muscle spasms., Disp: 30 tablet, Rfl: 0 etonogestrel-ethinyl estradiol (NUVARING) 0.12-0.015 MG/24HR vaginal ring, Insert vaginally and leave in place for 3 consecutive weeks, then remove for 1 week., Disp: 1 each, Rfl: 12;  oxyCODONE (ROXICODONE) 15 MG immediate release tablet, Take 15 mg by mouth. Take 1/2 to 1 tablet by mouth every 4 to 6 hours as needed for pain., Disp: , Rfl:  venlafaxine XR (EFFEXOR XR) 75 MG 24 hr capsule, Take 1 capsule  (75 mg total) by mouth daily., Disp: 30 capsule, Rfl: 5;  diazepam (VALIUM) 5 MG tablet, Take 5 mg by mouth every 6 (six) hours as needed., Disp: , Rfl: ;  diclofenac (VOLTAREN) 75 MG EC tablet, Take 1 tablet (75 mg total) by mouth 2 (two) times daily., Disp: 30 tablet, Rfl: 0 HYDROcodone-acetaminophen (VICODIN) 5-500 MG per tablet, 1 tab twice daily as needed for 1 week, then on tab daily for 1 week, then 1/2 tab daily as needed for one week then stop, Disp: 30 tablet, Rfl: 0  EXAM:  Filed Vitals:   01/13/13 1507  BP: 110/90  Pulse: 122  Temp: 98.9 F (37.2 C)    Body mass index is 28.61 kg/(m^2).  GENERAL: vitals reviewed and listed above, alert, oriented, appears well hydrated and in no acute distress  HEENT: atraumatic, conjunttiva clear, no obvious abnormalities on inspection of external nose and ears  NECK: no obvious masses on inspection  LUNGS: clear to auscultation bilaterally, no wheezes, rales or rhonchi, good air movement  CV: HRRR, no peripheral edema  MS/SKIN: moves all extremities without noticeable abnormality, healing surg scar on back, mild TTP bilat paraspinal lumbar muscles  PSYCH: pleasant and cooperative, no obvious depression or anxiety  ASSESSMENT AND PLAN:  Discussed the following assessment and plan:  Back pain - Plan: HYDROcodone-acetaminophen (VICODIN) 5-500 MG per tablet  -will switch back  to Vicodin and have her taper off of this - warned not to take more the 3000mg  tylenol daily total, she is to follow up immediately if any concerns. If feeling very unwell she is to go to the ED. Warned of withdrawal symptoms. -Patient advised to return or notify a doctor immediately if symptoms worsen or persist or new concerns arise.  There are no Patient Instructions on file for this visit.   Kriste Basque R.

## 2013-01-13 NOTE — Telephone Encounter (Signed)
Patient Information:  Caller Name: Natalie [name]  Phone: [phone]  Patient: Natalie [name], Natalie [name]  Gender: Female  DOB: October 04, 1986  Age: 27 Years  PCP: [facility])  Pregnant: No  Office Follow Up:  Does the office need to follow up with this patient?: No  Instructions For The Office: N/A  RN Note:  Patient calling about coming off narcotic pain medication.  Back surgery 11/09/12; has been taking hydrocodone x 2 months to manage pain.  Is in PT beginning 01/10/13.  States she has worked her way off the medication, but is having chills, shaking, and pain.  States is out of medication.  Advised appt today; will check schedule and call office back for appt.  krs/can  Symptoms  Reason For Call & Symptoms: Reducing amount of narcotic pain medication and having some symptoms of withdrawal.  Back surgery 11/09/12.  Reviewed Health History In EMR: Yes  Reviewed Medications In EMR: Yes  Reviewed Allergies In EMR: Yes  Reviewed Surgeries / Procedures: Yes  Date of Onset of Symptoms: Unknown OB / GYN:  LMP: Unknown  Guideline(s) Used:  No Protocol Available - Sick Adult  Disposition Per Guideline:   See Today or Tomorrow in Office  Reason For Disposition Reached:   Nursing judgment  Advice Given:  N/A  Patient Refused Recommendation:  Patient Will Follow Up With Office Later  Needs to check schedule and will call office for appt.  krs/can

## 2013-01-13 NOTE — Telephone Encounter (Signed)
They do not make HYDROcodone-acetaminophen (VICODIN) 5-500 MG per tablet anymore, Pharm needs new script for the correct 325mg  dosage  [phone] Pharm would also like to verify directions. Thank you

## 2013-01-14 MED ORDER — HYDROCODONE-ACETAMINOPHEN 5-325 MG PO TABS: ORAL_TABLET | ORAL | Status: DC

## 2013-01-14 NOTE — Telephone Encounter (Signed)
Rx called in to [facility] for hydrocodone acetaminophen 5-325 #30.

## 2013-01-16 ENCOUNTER — Encounter: Payer: Self-pay | Admitting: Family Medicine

## 2013-01-31 ENCOUNTER — Encounter: Payer: Self-pay | Admitting: Family

## 2013-05-04 ENCOUNTER — Other Ambulatory Visit: Payer: Self-pay

## 2013-05-04 MED ORDER — VENLAFAXINE HCL ER 75 MG PO CP24: 75.0000 mg | ORAL_CAPSULE | Freq: Every day | ORAL | Status: DC

## 2013-07-01 ENCOUNTER — Other Ambulatory Visit: Payer: Self-pay | Admitting: Family

## 2013-08-04 ENCOUNTER — Other Ambulatory Visit: Payer: Self-pay | Admitting: Family

## 2013-08-17 ENCOUNTER — Other Ambulatory Visit: Payer: Self-pay

## 2013-10-06 ENCOUNTER — Ambulatory Visit (INDEPENDENT_AMBULATORY_CARE_PROVIDER_SITE_OTHER): Payer: PRIVATE HEALTH INSURANCE | Admitting: Family Medicine

## 2013-10-06 ENCOUNTER — Encounter: Payer: Self-pay | Admitting: Family Medicine

## 2013-10-06 VITALS — BP 120/70 | HR 102 | Temp 98.2°F | Wt 184.0 lb

## 2013-10-06 DIAGNOSIS — N3090 Cystitis, unspecified without hematuria: Secondary | ICD-10-CM

## 2013-10-06 DIAGNOSIS — R300 Dysuria: Secondary | ICD-10-CM

## 2013-10-06 LAB — POCT URINALYSIS DIPSTICK
Bilirubin, UA: NEGATIVE
Ketones, UA: NEGATIVE
Nitrite, UA: NEGATIVE
Protein, UA: NEGATIVE

## 2013-10-06 MED ORDER — NITROFURANTOIN MONOHYD MACRO 100 MG PO CAPS: 100.0000 mg | ORAL_CAPSULE | Freq: Two times a day (BID) | ORAL | Status: DC

## 2013-10-06 NOTE — Patient Instructions (Signed)
Urinary Tract Infection  Urinary tract infections (UTIs) can develop anywhere along your urinary tract. Your urinary tract is your body's drainage system for removing wastes and extra water. Your urinary tract includes two kidneys, two ureters, a bladder, and a urethra. Your kidneys are a pair of bean-shaped organs. Each kidney is about the size of your fist. They are located below your ribs, one on each side of your spine.  CAUSES  Infections are caused by microbes, which are microscopic organisms, including fungi, viruses, and bacteria. These organisms are so small that they can only be seen through a microscope. Bacteria are the microbes that most commonly cause UTIs.  SYMPTOMS   Symptoms of UTIs may vary by age and gender of the patient and by the location of the infection. Symptoms in young women typically include a frequent and intense urge to urinate and a painful, burning feeling in the bladder or urethra during urination. Older women and men are more likely to be tired, shaky, and weak and have muscle aches and abdominal pain. A fever may mean the infection is in your kidneys. Other symptoms of a kidney infection include pain in your back or sides below the ribs, nausea, and vomiting.  DIAGNOSIS  To diagnose a UTI, your caregiver will ask you about your symptoms. Your caregiver also will ask to provide a urine sample. The urine sample will be tested for bacteria and white blood cells. White blood cells are made by your body to help fight infection.  TREATMENT   Typically, UTIs can be treated with medication. Because most UTIs are caused by a bacterial infection, they usually can be treated with the use of antibiotics. The choice of antibiotic and length of treatment depend on your symptoms and the type of bacteria causing your infection.  HOME CARE INSTRUCTIONS   If you were prescribed antibiotics, take them exactly as your caregiver instructs you. Finish the medication even if you feel better after you  have only taken some of the medication.   Drink enough water and fluids to keep your urine clear or pale yellow.   Avoid caffeine, tea, and carbonated beverages. They tend to irritate your bladder.   Empty your bladder often. Avoid holding urine for long periods of time.   Empty your bladder before and after sexual intercourse.   After a bowel movement, women should cleanse from front to back. Use each tissue only once.  SEEK MEDICAL CARE IF:    You have back pain.   You develop a fever.   Your symptoms do not begin to resolve within 3 days.  SEEK IMMEDIATE MEDICAL CARE IF:    You have severe back pain or lower abdominal pain.   You develop chills.   You have nausea or vomiting.   You have continued burning or discomfort with urination.  MAKE SURE YOU:    Understand these instructions.   Will watch your condition.   Will get help right away if you are not doing well or get worse.  Document Released: 07/08/2005 Document Revised: 03/29/2012 Document Reviewed: 11/06/2011  [employer] Patient Information 2014 [employer]

## 2013-10-06 NOTE — Progress Notes (Signed)
Pre visit review using our clinic review tool, if applicable. No additional management support is needed unless otherwise documented below in the visit note. 

## 2013-10-06 NOTE — Progress Notes (Signed)
   Subjective:    Patient ID: Natalie [name], female    DOB: 27-Aug-1986, 27 y.o.   MRN: [id number]  HPI Acute visit Patient seen with 2 day history of urine frequency and burning with urination. She had some minimal hematuria this morning. She's not currently on her menses. She denies any associated fever, chills, back pain, nausea, or vomiting. She's had only one previous UTI to her knowledge. No known drug allergies.  No past medical history on file. No past surgical history on file.  reports that she has never smoked. She does not have any smokeless tobacco history on file. She reports that she drinks alcohol. She reports that she does not use illicit drugs. family history includes Cancer in her maternal aunt. No Known Allergies    Review of Systems  Constitutional: Negative for fever, chills and appetite change.  Gastrointestinal: Negative for nausea, vomiting, abdominal pain, diarrhea and constipation.  Genitourinary: Positive for dysuria and frequency.  Musculoskeletal: Negative for back pain.  Neurological: Negative for dizziness.       Objective:   Physical Exam  Constitutional: She appears well-developed and well-nourished.  HENT:  Head: Normocephalic and atraumatic.  Neck: Neck supple. No thyromegaly present.  Cardiovascular: Normal rate, regular rhythm and normal heart sounds.   Pulmonary/Chest: Breath sounds normal.  Abdominal: Soft. Bowel sounds are normal. There is no tenderness.          Assessment & Plan:  Uncomplicated cystitis. Macrobid one twice a day for 3 days. Drink plenty of fluids. Followup as needed.

## 2013-10-10 ENCOUNTER — Other Ambulatory Visit: Payer: Self-pay | Admitting: Family

## 2013-12-29 ENCOUNTER — Ambulatory Visit (INDEPENDENT_AMBULATORY_CARE_PROVIDER_SITE_OTHER): Payer: 59 | Admitting: Family

## 2013-12-29 ENCOUNTER — Encounter: Payer: Self-pay | Admitting: Family

## 2013-12-29 VITALS — BP 108/74 | HR 84 | Ht 64.0 in | Wt 191.0 lb

## 2013-12-29 DIAGNOSIS — Z0000 Encounter for general adult medical examination without abnormal findings: Secondary | ICD-10-CM | POA: Diagnosis not present

## 2013-12-29 DIAGNOSIS — Z23 Encounter for immunization: Secondary | ICD-10-CM | POA: Diagnosis not present

## 2013-12-29 LAB — LIPID PANEL
CHOL/HDL RATIO: 4.5 ratio
CHOLESTEROL: 213 mg/dL — AB (ref 0–200)
HDL: 47 mg/dL (ref >39)
LDL Cholesterol: 149 mg/dL — ABNORMAL HIGH (ref 0–99)
TRIGLYCERIDES: 87 mg/dL (ref <150)
VLDL: 17 mg/dL (ref 0–40)

## 2013-12-29 LAB — COMPREHENSIVE METABOLIC PANEL
ALK PHOS: 75 U/L (ref 39–117)
ALT: 63 U/L — ABNORMAL HIGH (ref 0–35)
AST: 38 U/L — ABNORMAL HIGH (ref 0–37)
Albumin: 4.3 g/dL (ref 3.5–5.2)
BILIRUBIN TOTAL: 0.6 mg/dL (ref 0.2–1.2)
BUN: 12 mg/dL (ref 6–23)
CO2: 28 meq/L (ref 19–32)
CREATININE: 0.73 mg/dL (ref 0.50–1.10)
Calcium: 9.5 mg/dL (ref 8.4–10.5)
Chloride: 103 mEq/L (ref 96–112)
GLUCOSE: 73 mg/dL (ref 70–99)
Potassium: 3.9 mEq/L (ref 3.5–5.3)
Sodium: 139 mEq/L (ref 135–145)
Total Protein: 7 g/dL (ref 6.0–8.3)

## 2013-12-29 LAB — BASIC METABOLIC PANEL
BUN: 12 mg/dL (ref 6–23)
CO2: 28 meq/L (ref 19–32)
Calcium: 9.5 mg/dL (ref 8.4–10.5)
Chloride: 103 mEq/L (ref 96–112)
Creat: 0.73 mg/dL (ref 0.50–1.10)
GLUCOSE: 73 mg/dL (ref 70–99)
POTASSIUM: 3.9 meq/L (ref 3.5–5.3)
SODIUM: 139 meq/L (ref 135–145)

## 2013-12-29 LAB — POCT URINALYSIS DIPSTICK
Bilirubin, UA: NEGATIVE
GLUCOSE UA: NEGATIVE
KETONES UA: NEGATIVE
Nitrite, UA: NEGATIVE
PROTEIN UA: NEGATIVE
RBC UA: NEGATIVE
SPEC GRAV UA: 1.025
Urobilinogen, UA: 0.2
pH, UA: 6.5

## 2013-12-29 NOTE — Addendum Note (Signed)
Addended by: Beverely LowFRAZIER, Jens Siems L on: 12/29/2013 04:48 PM   Modules accepted: Orders

## 2013-12-29 NOTE — Progress Notes (Signed)
   Subjective:    Patient ID: Natalie [name], female    DOB: 1986/02/09, 28 y.o.   MRN: [id number]  HPI 28 year old white female, nonsmoker is in today for complete physical exam. Her weight is up approximately 35 pounds from her physical last year. She had low back pain requiring surgery and is now beginning to exercise again. Denies pain currently.   Review of Systems  Constitutional: Negative.   HENT: Negative.   Eyes: Negative.   Respiratory: Negative.   Cardiovascular: Negative.   Gastrointestinal: Negative.   Endocrine: Negative.   Genitourinary: Negative.   Musculoskeletal: Negative.   Skin: Negative.   Allergic/Immunologic: Negative.   Neurological: Negative.   Hematological: Negative.   Psychiatric/Behavioral: Negative.    History reviewed. No pertinent past medical history.  History   Social History  . Marital Status: Married    Spouse Name: N/A    Number of Children: N/A  . Years of Education: N/A   Occupational History  . Not on file.   Social History Main Topics  . Smoking status: Never Smoker   . Smokeless tobacco: Not on file  . Alcohol Use: Yes     Comment: occassionally  . Drug Use: No  . Sexual Activity: Not on file   Other Topics Concern  . Not on file   Social History Narrative  . No narrative on file    History reviewed. No pertinent past surgical history.  Family History  Problem Relation Age of Onset  . Cancer Maternal Aunt     breast    No Known Allergies  Current Outpatient Prescriptions on File Prior to Visit  Medication Sig Dispense Refill  . albuterol (PROVENTIL HFA;VENTOLIN HFA) 108 (90 BASE) MCG/ACT inhaler Inhale 2 puffs into the lungs every 6 (six) hours as needed for wheezing.  1 Inhaler  0  . cyclobenzaprine (FLEXERIL) 10 MG tablet Take 1 tablet (10 mg total) by mouth 3 (three) times daily as needed for muscle spasms.  30 tablet  0   No current facility-administered medications on file prior to visit.    BP 108/74   Pulse 84  Ht 5\' 4"  (1.626 m)  Wt 191 lb (86.637 kg)  BMI 32.77 kg/m2  LMP 03/02/2015chart    Objective:   Physical Exam  Constitutional: She is oriented to person, place, and time. She appears well-developed and well-nourished.  HENT:  Head: Normocephalic.  Right Ear: External ear normal.  Left Ear: External ear normal.  Nose: Nose normal.  Mouth/Throat: Oropharynx is clear and moist.  Eyes: Conjunctivae and EOM are normal. Pupils are equal, round, and reactive to light.  Neck: Normal range of motion. Neck supple. No thyromegaly present.  Cardiovascular: Normal rate, regular rhythm and normal heart sounds.   Pulmonary/Chest: Effort normal and breath sounds normal.  Abdominal: Soft. Bowel sounds are normal.  Musculoskeletal: Normal range of motion.  Neurological: She is alert and oriented to person, place, and time. She has normal reflexes.  Skin: Skin is warm and dry.  Psychiatric: She has a normal mood and affect.          Assessment & Plan:  Natalie [name] was seen today for annual exam.  Diagnoses and associated orders for this visit:  Preventative health care - POCT urinalysis dipstick - Lipid Panel - CMP - TSH - CBC with Differential   Call the office with any questions or concerns. Recheck as  schedule, and as needed.

## 2013-12-29 NOTE — Addendum Note (Signed)
Addended by: Rita OharaHRASHER, Vayden Weinand R on: 12/29/2013 04:52 PM   Modules accepted: Orders

## 2013-12-29 NOTE — Patient Instructions (Addendum)
Exercise to Stay Healthy Exercise helps you become and stay healthy. EXERCISE IDEAS AND TIPS Choose exercises that:  You enjoy.  Fit into your day. You do not need to exercise really hard to be healthy. You can do exercises at a slow or medium level and stay healthy. You can:  Stretch before and after working out.  Try yoga, Pilates, or tai chi.  Lift weights.  Walk fast, swim, jog, run, climb stairs, bicycle, dance, or rollerskate.  Take aerobic classes. Exercises that burn about 150 calories:  Running 1  miles in 15 minutes.  Playing volleyball for 45 to 60 minutes.  Washing and waxing a car for 45 to 60 minutes.  Playing touch football for 45 minutes.  Walking 1  miles in 35 minutes.  Pushing a stroller 1  miles in 30 minutes.  Playing basketball for 30 minutes.  Raking leaves for 30 minutes.  Bicycling 5 miles in 30 minutes.  Walking 2 miles in 30 minutes.  Dancing for 30 minutes.  Shoveling snow for 15 minutes.  Swimming laps for 20 minutes.  Walking up stairs for 15 minutes.  Bicycling 4 miles in 15 minutes.  Gardening for 30 to 45 minutes.  Jumping rope for 15 minutes.  Washing windows or floors for 45 to 60 minutes. Document Released: 10/31/2010 Document Revised: 12/21/2011 Document Reviewed: 10/31/2010 [facility] Patient Information 2014 [place], [place]. A will

## 2013-12-30 LAB — TSH: TSH: 1.781 u[IU]/mL (ref 0.350–4.500)

## 2014-01-01 ENCOUNTER — Telehealth: Payer: Self-pay

## 2014-01-01 NOTE — Telephone Encounter (Signed)
Left message to advise pt form ready to pick

## 2014-03-29 ENCOUNTER — Encounter: Payer: Self-pay | Admitting: Family

## 2014-03-29 ENCOUNTER — Ambulatory Visit (INDEPENDENT_AMBULATORY_CARE_PROVIDER_SITE_OTHER): Payer: 59 | Admitting: Family

## 2014-03-29 VITALS — BP 94/62 | HR 72 | Temp 98.0°F | Wt 188.0 lb

## 2014-03-29 DIAGNOSIS — Z331 Pregnant state, incidental: Secondary | ICD-10-CM

## 2014-03-29 DIAGNOSIS — R059 Cough, unspecified: Secondary | ICD-10-CM

## 2014-03-29 DIAGNOSIS — R05 Cough: Secondary | ICD-10-CM

## 2014-03-29 DIAGNOSIS — Z3491 Encounter for supervision of normal pregnancy, unspecified, first trimester: Secondary | ICD-10-CM

## 2014-03-29 DIAGNOSIS — R0982 Postnasal drip: Secondary | ICD-10-CM

## 2014-03-29 NOTE — Patient Instructions (Signed)
1. Claritin during the day and Benadryl at night   Cough, Adult  A cough is a reflex that helps clear your throat and airways. It can help heal the body or may be a reaction to an irritated airway. A cough may only last 2 or 3 weeks (acute) or may last more than 8 weeks (chronic).  CAUSES Acute cough:  Viral or bacterial infections. Chronic cough:  Infections.  Allergies.  Asthma.  Post-nasal drip.  Smoking.  Heartburn or acid reflux.  Some medicines.  Chronic lung problems (COPD).  Cancer. SYMPTOMS   Cough.  Fever.  Chest pain.  Increased breathing rate.  High-pitched whistling sound when breathing (wheezing).  Colored mucus that you cough up (sputum). TREATMENT   A bacterial cough may be treated with antibiotic medicine.  A viral cough must run its course and will not respond to antibiotics.  Your caregiver may recommend other treatments if you have a chronic cough. HOME CARE INSTRUCTIONS   Only take over-the-counter or prescription medicines for pain, discomfort, or fever as directed by your caregiver. Use cough suppressants only as directed by your caregiver.  Use a cold steam vaporizer or humidifier in your bedroom or home to help loosen secretions.  Sleep in a semi-upright position if your cough is worse at night.  Rest as needed.  Stop smoking if you smoke. SEEK IMMEDIATE MEDICAL CARE IF:   You have pus in your sputum.  Your cough starts to worsen.  You cannot control your cough with suppressants and are losing sleep.  You begin coughing up blood.  You have difficulty breathing.  You develop pain which is getting worse or is uncontrolled with medicine.  You have a fever. MAKE SURE YOU:   Understand these instructions.  Will watch your condition.  Will get help right away if you are not doing well or get worse. Document Released: 03/27/2011 Document Revised: 12/21/2011 Document Reviewed: 03/27/2011 [facility] Patient Information  2015 [place] [place]. This information is not intended to replace advice given to you by your health care provider. Make sure you discuss any questions you have with your health care provider.

## 2014-03-29 NOTE — Progress Notes (Signed)
Pre visit review using our clinic review tool, if applicable. No additional management support is needed unless otherwise documented below in the visit note. 

## 2014-03-29 NOTE — Progress Notes (Signed)
Subjective:    Patient ID: Natalie [name], female    DOB: 21-Mar-1986, 28 y.o.   MRN: [id number]  Cough Associated symptoms include postnasal drip. Pertinent negatives include no fever. Her past medical history is significant for environmental allergies.   28 year old white female, nonsmoker and G2 P1, currently pregnant [redacted] weeks gestation is in today with a cough productive clear phlegm. Denies any fever or chills. A new medication over-the-counter. Reports difficulty sleeping at night due to cough. Last menstrual period 02/04/2014. She is a Chartered loss adjusterschoolteacher.   Review of Systems  Constitutional: Negative.  Negative for fever.  HENT: Positive for congestion, postnasal drip and sneezing.   Respiratory: Positive for cough.   Cardiovascular: Negative.   Gastrointestinal: Negative.   Endocrine: Negative.   Genitourinary: Negative.   Musculoskeletal: Negative.   Skin: Negative.   Allergic/Immunologic: Positive for environmental allergies.  Neurological: Negative.   Hematological: Negative.   Psychiatric/Behavioral: Negative.    History reviewed. No pertinent past medical history.  History   Social History  . Marital Status: Married    Spouse Name: N/A    Number of Children: N/A  . Years of Education: N/A   Occupational History  . Not on file.   Social History Main Topics  . Smoking status: Never Smoker   . Smokeless tobacco: Not on file  . Alcohol Use: Yes     Comment: occassionally  . Drug Use: No  . Sexual Activity: Not on file   Other Topics Concern  . Not on file   Social History Narrative  . No narrative on file    History reviewed. No pertinent past surgical history.  Family History  Problem Relation Age of Onset  . Cancer Maternal Aunt     breast    No Known Allergies  Current Outpatient Prescriptions on File Prior to Visit  Medication Sig Dispense Refill  . albuterol (PROVENTIL HFA;VENTOLIN HFA) 108 (90 BASE) MCG/ACT inhaler Inhale 2 puffs into the lungs  every 6 (six) hours as needed for wheezing.  1 Inhaler  0  . cyclobenzaprine (FLEXERIL) 10 MG tablet Take 1 tablet (10 mg total) by mouth 3 (three) times daily as needed for muscle spasms.  30 tablet  0   No current facility-administered medications on file prior to visit.    BP 94/62  Pulse 72  Temp(Src) 98 F (36.7 C) (Oral)  Wt 188 lb (85.276 kg)chart    Objective:   Physical Exam  Constitutional: She is oriented to person, place, and time. She appears well-developed and well-nourished.  HENT:  Right Ear: External ear normal.  Left Ear: External ear normal.  Nose: Nose normal.  Mouth/Throat: Oropharynx is clear and moist.  Neck: Normal range of motion.  Cardiovascular: Normal rate, regular rhythm and normal heart sounds.   Pulmonary/Chest: Effort normal and breath sounds normal.  Musculoskeletal: Normal range of motion.  Neurological: She is alert and oriented to person, place, and time.  Skin: Skin is warm and dry.  Psychiatric: She has a normal mood and affect.          Assessment & Plan:   Problem List Items Addressed This Visit   None    Visit Diagnoses   Cough    -  Primary    Post-nasal drip        First trimester pregnancy           Claritin over-the-counter during the day and Benadryl at night. Take a daily prenatal vitamin. Call the office  with any questions or concerns. See GYN as discussed.

## 2014-04-05 ENCOUNTER — Other Ambulatory Visit: Payer: Self-pay

## 2014-04-06 LAB — CYTOLOGY - PAP

## 2014-04-30 ENCOUNTER — Encounter (HOSPITAL_COMMUNITY): Payer: 59 | Admitting: Certified Registered"

## 2014-04-30 ENCOUNTER — Ambulatory Visit (HOSPITAL_COMMUNITY)
Admission: AD | Admit: 2014-04-30 | Discharge: 2014-04-30 | Disposition: A | Discharge status: Home / Self Care | Payer: 59 | Source: Ambulatory Visit | Attending: Obstetrics and Gynecology | Admitting: Obstetrics and Gynecology

## 2014-04-30 ENCOUNTER — Encounter (HOSPITAL_COMMUNITY)
Admission: AD | Disposition: A | Discharge status: Home / Self Care | Payer: Self-pay | Source: Ambulatory Visit | Attending: Obstetrics and Gynecology

## 2014-04-30 ENCOUNTER — Encounter (HOSPITAL_COMMUNITY): Payer: Self-pay

## 2014-04-30 ENCOUNTER — Inpatient Hospital Stay: Admit: 2014-04-30 | Payer: Self-pay | Admitting: Obstetrics and Gynecology

## 2014-04-30 ENCOUNTER — Encounter (HOSPITAL_COMMUNITY): Payer: Self-pay | Admitting: Anesthesiology

## 2014-04-30 ENCOUNTER — Inpatient Hospital Stay (HOSPITAL_COMMUNITY): Payer: 59 | Admitting: Certified Registered"

## 2014-04-30 DIAGNOSIS — O021 Missed abortion: Secondary | ICD-10-CM | POA: Insufficient documentation

## 2014-04-30 DIAGNOSIS — O039 Complete or unspecified spontaneous abortion without complication: Secondary | ICD-10-CM | POA: Insufficient documentation

## 2014-04-30 HISTORY — PX: DILATION AND EVACUATION: SHX1459

## 2014-04-30 LAB — CBC
HCT: 35.1 % — ABNORMAL LOW (ref 36.0–46.0)
HEMOGLOBIN: 12.3 g/dL (ref 12.0–15.0)
MCH: 30.8 pg (ref 26.0–34.0)
MCHC: 35 g/dL (ref 30.0–36.0)
MCV: 88 fL (ref 78.0–100.0)
Platelets: 195 10*3/uL (ref 150–400)
RBC: 3.99 MIL/uL (ref 3.87–5.11)
RDW: 13.6 % (ref 11.5–15.5)
WBC: 9.5 10*3/uL (ref 4.0–10.5)

## 2014-04-30 LAB — TYPE AND SCREEN
ABO/RH(D): A POS
Antibody Screen: NEGATIVE

## 2014-04-30 SURGERY — DILATION AND EVACUATION, UTERUS
Anesthesia: Monitor Anesthesia Care | Site: Vagina

## 2014-04-30 MED ORDER — OXYCODONE-ACETAMINOPHEN 5-325 MG PO TABS: 1.0000 | ORAL_TABLET | ORAL | Status: DC | PRN

## 2014-04-30 MED ORDER — ONDANSETRON HCL 4 MG/2ML IJ SOLN
INTRAMUSCULAR | Status: DC | PRN
  Administered 2014-04-30: 4 mg via INTRAVENOUS

## 2014-04-30 MED ORDER — LIDOCAINE HCL (CARDIAC) 20 MG/ML IV SOLN
INTRAVENOUS | Status: DC | PRN
  Administered 2014-04-30: 50 mg via INTRAVENOUS

## 2014-04-30 MED ORDER — ONDANSETRON HCL 4 MG/2ML IJ SOLN
4.0000 mg | Freq: Once | INTRAMUSCULAR | Status: AC | PRN
  Administered 2014-04-30: 4 mg via INTRAVENOUS

## 2014-04-30 MED ORDER — DEXAMETHASONE SODIUM PHOSPHATE 10 MG/ML IJ SOLN
INTRAMUSCULAR | Status: DC | PRN
  Administered 2014-04-30: 10 mg via INTRAVENOUS

## 2014-04-30 MED ORDER — PROMETHAZINE HCL 25 MG/ML IJ SOLN
INTRAMUSCULAR | Status: AC
  Filled 2014-04-30: qty 1

## 2014-04-30 MED ORDER — PROMETHAZINE HCL 25 MG/ML IJ SOLN
12.5000 mg | Freq: Once | INTRAMUSCULAR | Status: AC
  Administered 2014-04-30: 12.5 mg via INTRAVENOUS

## 2014-04-30 MED ORDER — FENTANYL CITRATE 0.05 MG/ML IJ SOLN
INTRAMUSCULAR | Status: DC
Start: 2014-04-30 — End: 2014-05-01
  Filled 2014-04-30: qty 2

## 2014-04-30 MED ORDER — LACTATED RINGERS IV SOLN
INTRAVENOUS | Status: DC
  Administered 2014-04-30 (×3): via INTRAVENOUS

## 2014-04-30 MED ORDER — PROPOFOL 10 MG/ML IV BOLUS
INTRAVENOUS | Status: DC | PRN
  Administered 2014-04-30 (×9): 20 mg via INTRAVENOUS

## 2014-04-30 MED ORDER — MIDAZOLAM HCL 2 MG/2ML IJ SOLN
INTRAMUSCULAR | Status: DC | PRN
  Administered 2014-04-30: 2 mg via INTRAVENOUS

## 2014-04-30 MED ORDER — ONDANSETRON HCL 4 MG/2ML IJ SOLN
INTRAMUSCULAR | Status: AC
  Filled 2014-04-30: qty 2

## 2014-04-30 MED ORDER — MEPERIDINE HCL 25 MG/ML IJ SOLN: 6.2500 mg | INTRAMUSCULAR | Status: DC | PRN

## 2014-04-30 MED ORDER — LIDOCAINE HCL 1 % IJ SOLN
INTRAMUSCULAR | Status: DC | PRN
  Administered 2014-04-30: 10 mL

## 2014-04-30 MED ORDER — KETOROLAC TROMETHAMINE 30 MG/ML IJ SOLN
INTRAMUSCULAR | Status: DC | PRN
  Administered 2014-04-30: 30 mg via INTRAVENOUS

## 2014-04-30 MED ORDER — FENTANYL CITRATE 0.05 MG/ML IJ SOLN
25.0000 ug | INTRAMUSCULAR | Status: DC | PRN
  Administered 2014-04-30: 50 ug via INTRAVENOUS

## 2014-04-30 MED ORDER — FENTANYL CITRATE 0.05 MG/ML IJ SOLN
INTRAMUSCULAR | Status: DC | PRN
  Administered 2014-04-30: 100 ug via INTRAVENOUS

## 2014-04-30 MED ORDER — DEXTROSE 5 % IV SOLN
100.0000 mg | Freq: Once | INTRAVENOUS | Status: AC
  Administered 2014-04-30: 100 mg via INTRAVENOUS
  Filled 2014-04-30: qty 100

## 2014-04-30 MED ORDER — DOXYCYCLINE HYCLATE 50 MG PO CAPS: 50.0000 mg | ORAL_CAPSULE | Freq: Every day | ORAL | Status: AC

## 2014-04-30 MED ORDER — KETOROLAC TROMETHAMINE 30 MG/ML IJ SOLN: 15.0000 mg | Freq: Once | INTRAMUSCULAR | Status: DC | PRN

## 2014-04-30 SURGICAL SUPPLY — 18 items
CATH ROBINSON RED A/P 16FR (CATHETERS) ×2 IMPLANT
CLOTH BEACON ORANGE TIMEOUT ST (SAFETY) ×2 IMPLANT
DECANTER SPIKE VIAL GLASS SM (MISCELLANEOUS) ×2 IMPLANT
GLOVE BIO SURGEON STRL SZ 6.5 (GLOVE) ×2 IMPLANT
GLOVE BIOGEL PI IND STRL 6.5 (GLOVE) ×1 IMPLANT
GLOVE BIOGEL PI INDICATOR 6.5 (GLOVE) ×1
GOWN STRL REUS W/TWL LRG LVL3 (GOWN DISPOSABLE) ×4 IMPLANT
KIT BERKELEY 1ST TRIMESTER 3/8 (MISCELLANEOUS) ×2 IMPLANT
NS IRRIG 1000ML POUR BTL (IV SOLUTION) ×2 IMPLANT
PACK VAGINAL MINOR WOMEN LF (CUSTOM PROCEDURE TRAY) ×2 IMPLANT
PAD OB MATERNITY 4.3X12.25 (PERSONAL CARE ITEMS) ×2 IMPLANT
PAD PREP 24X48 CUFFED NSTRL (MISCELLANEOUS) ×2 IMPLANT
SET BERKELEY SUCTION TUBING (SUCTIONS) ×2 IMPLANT
TOWEL OR 17X24 6PK STRL BLUE (TOWEL DISPOSABLE) ×4 IMPLANT
VACURETTE 10 RIGID CVD (CANNULA) ×2 IMPLANT
VACURETTE 7MM CVD STRL WRAP (CANNULA) IMPLANT
VACURETTE 8 RIGID CVD (CANNULA) IMPLANT
VACURETTE 9 RIGID CVD (CANNULA) IMPLANT

## 2014-04-30 NOTE — MAU Note (Signed)
Pt sent over from office for D&E at 830pm. Denies bleeding or pain

## 2014-04-30 NOTE — H&P (Signed)
28 y.o.  cG2P1001 presents for scheduled D&E for 11week demise  PMedHx:asthma,  situational anxiety/depression no meds PSurgHx: L4-L5 diicectomy  History   Social History  . Marital Status: Married    Spouse Name: N/A    Number of Children: N/A  . Years of Education: N/A   Occupational History  . Not on file.   Social History Main Topics  . Smoking status: Never Smoker   . Smokeless tobacco: Not on file  . Alcohol Use: Yes     Comment: occassionally  . Drug Use: No  . Sexual Activity: Not on file   Other Topics Concern  . Not on file   Social History Narrative  . No narrative on file    No current facility-administered medications on file prior to encounter.   Current Outpatient Prescriptions on File Prior to Encounter  Medication Sig Dispense Refill  . albuterol (PROVENTIL HFA;VENTOLIN HFA) 108 (90 BASE) MCG/ACT inhaler Inhale 2 puffs into the lungs every 6 (six) hours as needed for wheezing.  1 Inhaler  0  . cyclobenzaprine (FLEXERIL) 10 MG tablet Take 1 tablet (10 mg total) by mouth 3 (three) times daily as needed for muscle spasms.  30 tablet  0    No Known Allergies  @VITALS2 @  Lungs: clear to ascultation Cor:  RRR Abdomen:  soft, nontender, nondistended. Ex:  no cords, erythema Pelvic:  Deferred to OR  A:  11week fetal demise   P:  D&E.   All risks, benefits and alternatives d/w patient and she desires to proceed.  IV doxycycline 100mg  IV access Other routine pre-op care MineralALLAHAN, Luther ParodySIDNEY

## 2014-04-30 NOTE — Anesthesia Postprocedure Evaluation (Signed)
Anesthesia Post Note  Patient: Natalie BaumgartnerKeri B [name]  Procedure(s) Performed: Procedure(s) (LRB): DILATATION AND EVACUATION (N/A)  Anesthesia type: MAC  Patient location: PACU  Post pain: Pain level controlled  Post assessment: Post-op Vital signs reviewed  Last Vitals:  Filed Vitals:   04/30/14 1924  BP: 125/78  Pulse: 93  Temp: 37.1 C  Resp: 18    Post vital signs: Reviewed  Level of consciousness: sedated  Complications: No apparent anesthesia complications

## 2014-04-30 NOTE — Transfer of Care (Signed)
Immediate Anesthesia Transfer of Care Note  Patient: Arty BaumgartnerKeri B Oestreich  Procedure(s) Performed: Procedure(s): DILATATION AND EVACUATION (N/A)  Patient Location: PACU  Anesthesia Type:MAC  Level of Consciousness: awake and sedated  Airway & Oxygen Therapy: Patient Spontanous Breathing  Post-op Assessment: Report given to PACU RN  Post vital signs: Reviewed and stable  Complications: No apparent anesthesia complications

## 2014-04-30 NOTE — Discharge Instructions (Signed)

## 2014-04-30 NOTE — Brief Op Note (Signed)
04/30/2014  9:26 PM  PATIENT:  Natalie BaumgartnerKeri B [name]  28 y.o. female  PRE-OPERATIVE DIAGNOSIS:  11 week missed AB  POST-OPERATIVE DIAGNOSIS:  missed AB  PROCEDURE:  Procedure(s): DILATATION AND EVACUATION (N/A)  SURGEON:  Surgeon(s) and Role:    Philip Aspen* Magdaleno Lortie, DO - Primary  PHYSICIAN ASSISTANT:   ASSISTANTS: none   ANESTHESIA:  IV sedation, 10cc 1% lidocaine pericervical block  EBL:  Total I/O In: 1200 [I.V.:1200] Out: 275 [Urine:150; Blood:125]  SPECIMEN:  POC  DISPOSITION OF SPECIMEN:  PATHOLOGY  COUNTS:  YES  PLAN OF CARE: Discharge to home after PACU  PATIENT DISPOSITION:  PACU - hemodynamically stable.   Delay start of Pharmacological VTE agent (>24hrs) due to surgical blood loss or risk of bleeding: not applicable

## 2014-04-30 NOTE — Anesthesia Preprocedure Evaluation (Signed)
Anesthesia Evaluation  Patient identified by MRN, date of birth, ID band Patient awake    Reviewed: Allergy & Precautions, H&P , NPO status , Patient's Chart, lab work & pertinent test results  Airway Mallampati: II TM Distance: >3 FB Neck ROM: full    Dental  (+) Teeth Intact   Pulmonary neg pulmonary ROS,    Pulmonary exam normal       Cardiovascular negative cardio ROS      Neuro/Psych negative neurological ROS  negative psych ROS   GI/Hepatic negative GI ROS, Neg liver ROS,   Endo/Other  negative endocrine ROS  Renal/GU negative Renal ROS     Musculoskeletal   Abdominal Normal abdominal exam  (+)   Peds  Hematology negative hematology ROS (+)   Anesthesia Other Findings   Reproductive/Obstetrics negative OB ROS                           Anesthesia Physical Anesthesia Plan  ASA: II  Anesthesia Plan: MAC   Post-op Pain Management:    Induction: Intravenous  Airway Management Planned:   Additional Equipment:   Intra-op Plan:   Post-operative Plan:   Informed Consent: I have reviewed the patients History and Physical, chart, labs and discussed the procedure including the risks, benefits and alternatives for the proposed anesthesia with the patient or authorized representative who has indicated his/her understanding and acceptance.     Plan Discussed with: CRNA and Surgeon  Anesthesia Plan Comments:         Anesthesia Quick Evaluation

## 2014-05-01 LAB — ABO/RH: ABO/RH(D): A POS

## 2014-05-01 NOTE — Op Note (Signed)
Natalie Lodge:  [name], Natalie [name]                  ACCOUNT NO.:  [id number]  MEDICAL RECORD NO.:  [id number]  LOCATION:  [facility]                          FACILITY:  [facility]  PHYSICIAN:  Philip AspenSidney Emme Rosenau, DO    DATE OF BIRTH:  June 01, 1986  DATE OF PROCEDURE:  04/30/2014 DATE OF DISCHARGE:  04/30/2014                              OPERATIVE REPORT   PREOPERATIVE DIAGNOSIS:  An 11-week missed abortion.  POSTOPERATIVE DIAGNOSIS:  An 11-week missed abortion.  PROCEDURE:  Dilation and evacuation.  SURGEON:  Philip AspenSidney Emslee Lopezmartinez, DO  ANESTHESIA:  IV sedation, and 10 mL of 1% lidocaine for paracervical block.  IV FLUIDS:  1200 mL.  URINE OUTPUT:  150 mL.  ESTIMATED BLOOD LOSS:  125 mL.  SPECIMEN:  Products of conception.  DISPOSITION:  To pathology.  ANTIBIOTICS:  100 mg of IV doxycycline preoperatively.  FINDINGS:  Approximately 12-week size anteverted uterus, otherwise normal vagina, cervix, and vulva.  DESCRIPTION OF PROCEDURE:  The patient was taken to the operating room where IV sedation was administered and found to be adequate.  She was prepped and draped in the normal sterile fashion in dorsal lithotomy position.  The patient's bladder was emptied with a straight cath and speculum was placed in the vagina.  The cervix was grasped with the anterior lip with tenaculum, and serial dilation to 37 Pratt was performed.  A 12-mm curved suction curette was entered through the cervix and gently down to the fundus.  Suction was applied and one pass with abundant tissue was removed.  Sharp curettage was performed, and a second pass of suction curette was performed showing minimal products only frothy blood.  Final pass of gentle curettage of all 4 quadrants was performed with no additional products of conception noted. Tenaculum was removed.  Pressure was applied, and excellent hemostasis was noted.  The patient was cleaned, dried, and placed back in dorsal supine position.  The patient tolerated  the procedure well.  Sponge, lap, and needle counts were correct x2.  The patient was taken to recovery in stable condition.          ______________________________ Philip AspenSidney Shekia Kuper, DO     [place]/MEDQ  D:  04/30/2014  T:  05/01/2014  Job:  [id number]

## 2014-05-04 ENCOUNTER — Encounter (HOSPITAL_COMMUNITY): Payer: Self-pay | Admitting: Obstetrics and Gynecology

## 2014-08-13 ENCOUNTER — Encounter (HOSPITAL_COMMUNITY): Payer: Self-pay | Admitting: Obstetrics and Gynecology

## 2014-09-14 ENCOUNTER — Ambulatory Visit (INDEPENDENT_AMBULATORY_CARE_PROVIDER_SITE_OTHER): Payer: PRIVATE HEALTH INSURANCE | Admitting: Family

## 2014-09-14 ENCOUNTER — Encounter: Payer: Self-pay | Admitting: Family

## 2014-09-14 VITALS — BP 108/64 | HR 92 | Wt 178.6 lb

## 2014-09-14 DIAGNOSIS — Z23 Encounter for immunization: Secondary | ICD-10-CM

## 2014-09-14 DIAGNOSIS — Z8742 Personal history of other diseases of the female genital tract: Secondary | ICD-10-CM

## 2014-09-14 DIAGNOSIS — Z3490 Encounter for supervision of normal pregnancy, unspecified, unspecified trimester: Secondary | ICD-10-CM

## 2014-09-14 DIAGNOSIS — Z3201 Encounter for pregnancy test, result positive: Secondary | ICD-10-CM

## 2014-09-14 DIAGNOSIS — Z8759 Personal history of other complications of pregnancy, childbirth and the puerperium: Secondary | ICD-10-CM

## 2014-09-14 DIAGNOSIS — N912 Amenorrhea, unspecified: Secondary | ICD-10-CM

## 2014-09-14 NOTE — Progress Notes (Signed)
Pre visit review using our clinic review tool, if applicable. No additional management support is needed unless otherwise documented below in the visit note. 

## 2014-09-14 NOTE — Patient Instructions (Addendum)
1. [facility] 2. [facility]  First Trimester of Pregnancy The first trimester of pregnancy is from week 1 until the end of week 12 (months 1 through 3). A week after a sperm fertilizes an egg, the egg will implant on the wall of the uterus. This embryo will begin to develop into a baby. Genes from you and your partner are forming the baby. The female genes determine whether the baby is a boy or a girl. At 6-8 weeks, the eyes and face are formed, and the heartbeat can be seen on ultrasound. At the end of 12 weeks, all the baby's organs are formed.  Now that you are pregnant, you will want to do everything you can to have a healthy baby. Two of the most important things are to get good prenatal care and to follow your health care provider's instructions. Prenatal care is all the medical care you receive before the baby's birth. This care will help prevent, find, and treat any problems during the pregnancy and childbirth. BODY CHANGES Your body goes through many changes during pregnancy. The changes vary from woman to woman.   You may gain or lose a couple of pounds at first.  You may feel sick to your stomach (nauseous) and throw up (vomit). If the vomiting is uncontrollable, call your health care provider.  You may tire easily.  You may develop headaches that can be relieved by medicines approved by your health care provider.  You may urinate more often. Painful urination may mean you have a bladder infection.  You may develop heartburn as a result of your pregnancy.  You may develop constipation because certain hormones are causing the muscles that push waste through your intestines to slow down.  You may develop hemorrhoids or swollen, bulging veins (varicose veins).  Your breasts may begin to grow larger and become tender. Your nipples may stick out more, and the tissue that surrounds them (areola) may become darker.  Your gums may bleed and may be sensitive to brushing  and flossing.  Dark spots or blotches (chloasma, mask of pregnancy) may develop on your face. This will likely fade after the baby is born.  Your menstrual periods will stop.  You may have a loss of appetite.  You may develop cravings for certain kinds of food.  You may have changes in your emotions from day to day, such as being excited to be pregnant or being concerned that something may go wrong with the pregnancy and baby.  You may have more vivid and strange dreams.  You may have changes in your hair. These can include thickening of your hair, rapid growth, and changes in texture. Some women also have hair loss during or after pregnancy, or hair that feels dry or thin. Your hair will most likely return to normal after your baby is born. WHAT TO EXPECT AT YOUR PRENATAL VISITS During a routine prenatal visit:  You will be weighed to make sure you and the baby are growing normally.  Your blood pressure will be taken.  Your abdomen will be measured to track your baby's growth.  The fetal heartbeat will be listened to starting around week 10 or 12 of your pregnancy.  Test results from any previous visits will be discussed. Your health care provider may ask you:  How you are feeling.  If you are feeling the baby move.  If you have had any abnormal symptoms, such as leaking fluid, bleeding, severe headaches, or abdominal  cramping.  If you have any questions. Other tests that may be performed during your first trimester include:  Blood tests to find your blood type and to check for the presence of any previous infections. They will also be used to check for low iron levels (anemia) and Rh antibodies. Later in the pregnancy, blood tests for diabetes will be done along with other tests if problems develop.  Urine tests to check for infections, diabetes, or protein in the urine.  An ultrasound to confirm the proper growth and development of the baby.  An amniocentesis to check  for possible genetic problems.  Fetal screens for spina bifida and Down syndrome.  You may need other tests to make sure you and the baby are doing well. HOME CARE INSTRUCTIONS  Medicines  Follow your health care provider's instructions regarding medicine use. Specific medicines may be either safe or unsafe to take during pregnancy.  Take your prenatal vitamins as directed.  If you develop constipation, try taking a stool softener if your health care provider approves. Diet  Eat regular, well-balanced meals. Choose a variety of foods, such as meat or vegetable-based protein, fish, milk and low-fat dairy products, vegetables, fruits, and whole grain breads and cereals. Your health care provider will help you determine the amount of weight gain that is right for you.  Avoid raw meat and uncooked cheese. These carry germs that can cause birth defects in the baby.  Eating four or five small meals rather than three large meals a day may help relieve nausea and vomiting. If you start to feel nauseous, eating a few soda crackers can be helpful. Drinking liquids between meals instead of during meals also seems to help nausea and vomiting.  If you develop constipation, eat more high-fiber foods, such as fresh vegetables or fruit and whole grains. Drink enough fluids to keep your urine clear or pale yellow. Activity and Exercise  Exercise only as directed by your health care provider. Exercising will help you:  Control your weight.  Stay in shape.  Be prepared for labor and delivery.  Experiencing pain or cramping in the lower abdomen or low back is a good sign that you should stop exercising. Check with your health care provider before continuing normal exercises.  Try to avoid standing for long periods of time. Move your legs often if you must stand in one place for a long time.  Avoid heavy lifting.  Wear low-heeled shoes, and practice good posture.  You may continue to have sex  unless your health care provider directs you otherwise. Relief of Pain or Discomfort  Wear a good support bra for breast tenderness.   Take warm sitz baths to soothe any pain or discomfort caused by hemorrhoids. Use hemorrhoid cream if your health care provider approves.   Rest with your legs elevated if you have leg cramps or low back pain.  If you develop varicose veins in your legs, wear support hose. Elevate your feet for 15 minutes, 3-4 times a day. Limit salt in your diet. Prenatal Care  Schedule your prenatal visits by the twelfth week of pregnancy. They are usually scheduled monthly at first, then more often in the last 2 months before delivery.  Write down your questions. Take them to your prenatal visits.  Keep all your prenatal visits as directed by your health care provider. Safety  Wear your seat belt at all times when driving.  Make a list of emergency phone numbers, including numbers for family, friends,  the hospital, and police and fire departments. General Tips  Ask your health care provider for a referral to a local prenatal education class. Begin classes no later than at the beginning of month 6 of your pregnancy.  Ask for help if you have counseling or nutritional needs during pregnancy. Your health care provider can offer advice or refer you to specialists for help with various needs.  Do not use hot tubs, steam rooms, or saunas.  Do not douche or use tampons or scented sanitary pads.  Do not cross your legs for long periods of time.  Avoid cat litter boxes and soil used by cats. These carry germs that can cause birth defects in the baby and possibly loss of the fetus by miscarriage or stillbirth.  Avoid all smoking, herbs, alcohol, and medicines not prescribed by your health care provider. Chemicals in these affect the formation and growth of the baby.  Schedule a dentist appointment. At home, brush your teeth with a soft toothbrush and be gentle when you  floss. SEEK MEDICAL CARE IF:   You have dizziness.  You have mild pelvic cramps, pelvic pressure, or nagging pain in the abdominal area.  You have persistent nausea, vomiting, or diarrhea.  You have a bad smelling vaginal discharge.  You have pain with urination.  You notice increased swelling in your face, hands, legs, or ankles. SEEK IMMEDIATE MEDICAL CARE IF:   You have a fever.  You are leaking fluid from your vagina.  You have spotting or bleeding from your vagina.  You have severe abdominal cramping or pain.  You have rapid weight gain or loss.  You vomit blood or material that looks like coffee grounds.  You are exposed to [place] measles and have never had them.  You are exposed to fifth disease or chickenpox.  You develop a severe headache.  You have shortness of breath.  You have any kind of trauma, such as from a fall or a car accident. Document Released: 09/22/2001 Document Revised: 02/12/2014 Document Reviewed: 08/08/2013 [facility] Patient Information 2015 [employer] [place]. This information is not intended to replace advice given to you by your health care provider. Make sure you discuss any questions you have with your health care provider.

## 2014-09-17 ENCOUNTER — Encounter: Payer: Self-pay | Admitting: Family

## 2014-09-17 DIAGNOSIS — Z8759 Personal history of other complications of pregnancy, childbirth and the puerperium: Secondary | ICD-10-CM | POA: Insufficient documentation

## 2014-09-17 HISTORY — DX: Personal history of other complications of pregnancy, childbirth and the puerperium: Z87.59

## 2014-09-17 NOTE — Progress Notes (Signed)
   Subjective:    Patient ID: Natalie [name], female    DOB: 10-24-1985, 28 y.o.   MRN: [id number]  HPI 28 year old female, G2P0, nonsmoker is in today with complaints of amenorrhea and positive pregnancy test at home. She is here for pregnancy confirmation. Last menstrual period was 08/08/2014.   Review of Systems  Constitutional: Negative.   Respiratory: Negative.   Cardiovascular: Negative.   Gastrointestinal: Negative.   Genitourinary:       Missed period   Musculoskeletal: Negative.   Allergic/Immunologic: Negative.   Neurological: Negative.   Psychiatric/Behavioral: Negative.    No past medical history on file.  History   Social History  . Marital Status: Married    Spouse Name: N/A    Number of Children: N/A  . Years of Education: N/A   Occupational History  . Not on file.   Social History Main Topics  . Smoking status: Never Smoker   . Smokeless tobacco: Not on file  . Alcohol Use: Yes     Comment: occassionally  . Drug Use: No  . Sexual Activity: Not on file   Other Topics Concern  . Not on file   Social History Narrative    Past Surgical History  Procedure Laterality Date  . Back surgery    . Dilation and evacuation N/A 04/30/2014    Procedure: DILATATION AND EVACUATION;  Surgeon: Philip AspenSidney Callahan, DO;  Location: [facility];  Service: Gynecology;  Laterality: N/A;    Family History  Problem Relation Age of Onset  . Cancer Maternal Aunt     breast    No Known Allergies  Current Outpatient Prescriptions on File Prior to Visit  Medication Sig Dispense Refill  . Prenatal Vit-Fe Fumarate-FA (PRENATAL MULTIVITAMIN) TABS tablet Take 1 tablet by mouth daily at 12 noon.     No current facility-administered medications on file prior to visit.    BP 108/64 mmHg  Pulse 92  Wt 178 lb 9.6 oz (81.012 kg)  LMP 10/28/2015chart    Objective:   Physical Exam  Constitutional: She is oriented to person, place, and time. She appears well-developed and  well-nourished.  Neck: Normal range of motion. Neck supple. No thyromegaly present.  Cardiovascular: Normal rate, regular rhythm and normal heart sounds.   Pulmonary/Chest: Breath sounds normal.  Musculoskeletal: Normal range of motion.  Neurological: She is alert and oriented to person, place, and time.  Skin: Skin is warm and dry.  Psychiatric: She has a normal mood and affect.     Positive urine pregnancy test.      Assessment & Plan:  Natalie [name] was seen today for amenorrhea.  Diagnoses and associated orders for this visit:  Amenorrhea  Need for influenza vaccination - Flu Vaccine QUAD 36+ mos PF IM (Fluarix Quad PF)  Pregnancy    Confirmed pregnancy with the estimated date of delivery 05/18/2015. Advised to see gynecology as soon as possible. Prenatal vitamin daily.

## 2014-10-18 ENCOUNTER — Other Ambulatory Visit: Payer: Self-pay | Admitting: Obstetrics & Gynecology

## 2014-10-19 LAB — CYTOLOGY - PAP

## 2014-10-23 MED ORDER — DOXYCYCLINE HYCLATE 100 MG IV SOLR
100.0000 mg | Freq: Once | INTRAVENOUS | Status: AC
  Administered 2014-10-24: 100 mg via INTRAVENOUS
  Filled 2014-10-23: qty 100

## 2014-10-23 NOTE — H&P (Signed)
Natalie BaumgartnerKeri B [name] is an 29 y.o. female with missed abortion at 7 weeks. Blood type A+.  Patient had prior missed AB in 04/2014 which required D&C and would like genetic studies on this pathology.    Pertinent Gynecological History: Menses: n/a Bleeding: pregnant Contraception: none DES exposure: unknown Blood transfusions: none Sexually transmitted diseases: no past history Previous GYN Procedures: DNC  Last mammogram: n/a Date: n/a Last pap: normal Date: 2016 OB History: G3, P1   Menstrual History: Menarche age: n/a No LMP recorded.    No past medical history on file.  Past Surgical History  Procedure Laterality Date  . Back surgery    . Dilation and evacuation N/A 04/30/2014    Procedure: DILATATION AND EVACUATION;  Surgeon: Philip AspenSidney Callahan, DO;  Location: [facility];  Service: Gynecology;  Laterality: N/A;    Family History  Problem Relation Age of Onset  . Cancer Maternal Aunt     breast    Social History:  reports that she has never smoked. She does not have any smokeless tobacco history on file. She reports that she drinks alcohol. She reports that she does not use illicit drugs.  Allergies: No Known Allergies  No prescriptions prior to admission    ROS  unknown if currently breastfeeding. Physical Exam  Constitutional: She is oriented to person, place, and time. She appears well-developed and well-nourished.  GI: Soft. There is no rebound and no guarding.  Neurological: She is alert and oriented to person, place, and time.  Skin: Skin is warm and dry.  Psychiatric: She has a normal mood and affect. Her behavior is normal.    No results found for this or any previous visit (from the past 24 hour(s)).  No results found.  Assessment/Plan: 28yo G3P1 with missed ab at 7 weeks. -Suction D&C with genetic studies -Patient is counseled re: risk of bleeding, infection, scarring and damage to surrounding structures.  Patient understands risk of Asherman's syndrome and  potential effects in future pregnancies and placentation.  All questions were answered and the patient wishes to proceed.  Natalie [name] 10/23/2014, 8:37 PM

## 2014-10-24 ENCOUNTER — Encounter (HOSPITAL_COMMUNITY)
Admission: RE | Disposition: A | Discharge status: Home / Self Care | Payer: Self-pay | Source: Ambulatory Visit | Attending: Obstetrics & Gynecology

## 2014-10-24 ENCOUNTER — Ambulatory Visit (HOSPITAL_COMMUNITY): Payer: PRIVATE HEALTH INSURANCE | Admitting: Anesthesiology

## 2014-10-24 ENCOUNTER — Encounter (HOSPITAL_COMMUNITY): Payer: Self-pay | Admitting: *Deleted

## 2014-10-24 ENCOUNTER — Ambulatory Visit (HOSPITAL_COMMUNITY)
Admission: RE | Admit: 2014-10-24 | Discharge: 2014-10-24 | Disposition: A | Discharge status: Home / Self Care | Payer: PRIVATE HEALTH INSURANCE | Source: Ambulatory Visit | Attending: Obstetrics & Gynecology | Admitting: Obstetrics & Gynecology

## 2014-10-24 DIAGNOSIS — Z3A01 Less than 8 weeks gestation of pregnancy: Secondary | ICD-10-CM | POA: Insufficient documentation

## 2014-10-24 DIAGNOSIS — O021 Missed abortion: Secondary | ICD-10-CM | POA: Insufficient documentation

## 2014-10-24 HISTORY — PX: DILATION AND EVACUATION: SHX1459

## 2014-10-24 LAB — CBC
HCT: 37.6 % (ref 36.0–46.0)
Hemoglobin: 12.9 g/dL (ref 12.0–15.0)
MCH: 31 pg (ref 26.0–34.0)
MCHC: 34.3 g/dL (ref 30.0–36.0)
MCV: 90.4 fL (ref 78.0–100.0)
Platelets: 190 10*3/uL (ref 150–400)
RBC: 4.16 MIL/uL (ref 3.87–5.11)
RDW: 13.1 % (ref 11.5–15.5)
WBC: 8.5 10*3/uL (ref 4.0–10.5)

## 2014-10-24 SURGERY — DILATION AND EVACUATION, UTERUS
Anesthesia: Monitor Anesthesia Care | Site: Uterus

## 2014-10-24 MED ORDER — METOCLOPRAMIDE HCL 5 MG/ML IJ SOLN
INTRAMUSCULAR | Status: DC | PRN
  Administered 2014-10-24: 10 mg via INTRAVENOUS

## 2014-10-24 MED ORDER — METOCLOPRAMIDE HCL 5 MG/ML IJ SOLN
INTRAMUSCULAR | Status: AC
  Filled 2014-10-24: qty 2

## 2014-10-24 MED ORDER — SCOPOLAMINE 1 MG/3DAYS TD PT72
MEDICATED_PATCH | TRANSDERMAL | Status: AC
  Administered 2014-10-24: 1.5 mg via TRANSDERMAL
  Filled 2014-10-24: qty 1

## 2014-10-24 MED ORDER — CHLOROPROCAINE HCL 1 % IJ SOLN
INTRAMUSCULAR | Status: DC | PRN
  Administered 2014-10-24: 10 mL

## 2014-10-24 MED ORDER — ONDANSETRON HCL 4 MG/2ML IJ SOLN
INTRAMUSCULAR | Status: DC | PRN
  Administered 2014-10-24: 4 mg via INTRAVENOUS

## 2014-10-24 MED ORDER — FENTANYL CITRATE 0.05 MG/ML IJ SOLN
INTRAMUSCULAR | Status: DC | PRN
  Administered 2014-10-24: 50 ug via INTRAVENOUS
  Administered 2014-10-24: 100 ug via INTRAVENOUS

## 2014-10-24 MED ORDER — MIDAZOLAM HCL 2 MG/2ML IJ SOLN
INTRAMUSCULAR | Status: DC | PRN
  Administered 2014-10-24: 2 mg via INTRAVENOUS

## 2014-10-24 MED ORDER — MEPERIDINE HCL 25 MG/ML IJ SOLN: 6.2500 mg | INTRAMUSCULAR | Status: DC | PRN

## 2014-10-24 MED ORDER — OXYCODONE-ACETAMINOPHEN 5-325 MG PO TABS: 1.0000 | ORAL_TABLET | ORAL | Status: DC | PRN

## 2014-10-24 MED ORDER — KETOROLAC TROMETHAMINE 30 MG/ML IJ SOLN
INTRAMUSCULAR | Status: AC
  Filled 2014-10-24: qty 1

## 2014-10-24 MED ORDER — KETOROLAC TROMETHAMINE 30 MG/ML IJ SOLN
INTRAMUSCULAR | Status: DC | PRN
  Administered 2014-10-24: 30 mg via INTRAVENOUS

## 2014-10-24 MED ORDER — DEXAMETHASONE SODIUM PHOSPHATE 10 MG/ML IJ SOLN
INTRAMUSCULAR | Status: AC
  Filled 2014-10-24: qty 1

## 2014-10-24 MED ORDER — DEXTROSE IN LACTATED RINGERS 5 % IV SOLN: INTRAVENOUS | Status: DC

## 2014-10-24 MED ORDER — PROPOFOL INFUSION 10 MG/ML OPTIME
INTRAVENOUS | Status: DC | PRN
  Administered 2014-10-24: 120 ug/kg/min via INTRAVENOUS

## 2014-10-24 MED ORDER — ACETAMINOPHEN 325 MG PO TABS: 325.0000 mg | ORAL_TABLET | ORAL | Status: DC | PRN

## 2014-10-24 MED ORDER — MIDAZOLAM HCL 2 MG/2ML IJ SOLN
INTRAMUSCULAR | Status: AC
  Filled 2014-10-24: qty 2

## 2014-10-24 MED ORDER — PROPOFOL 10 MG/ML IV BOLUS
INTRAVENOUS | Status: AC
  Filled 2014-10-24: qty 40

## 2014-10-24 MED ORDER — SCOPOLAMINE 1 MG/3DAYS TD PT72
1.0000 | MEDICATED_PATCH | Freq: Once | TRANSDERMAL | Status: DC
  Administered 2014-10-24: 1.5 mg via TRANSDERMAL

## 2014-10-24 MED ORDER — FENTANYL CITRATE 0.05 MG/ML IJ SOLN: 25.0000 ug | INTRAMUSCULAR | Status: DC | PRN

## 2014-10-24 MED ORDER — DOXYCYCLINE HYCLATE 100 MG PO TABS
ORAL_TABLET | ORAL | Status: AC
  Administered 2014-10-24: 200 mg via ORAL
  Filled 2014-10-24: qty 2

## 2014-10-24 MED ORDER — ZOLPIDEM TARTRATE ER 6.25 MG PO TBCR: 6.2500 mg | EXTENDED_RELEASE_TABLET | Freq: Every evening | ORAL | Status: DC | PRN

## 2014-10-24 MED ORDER — LACTATED RINGERS IV SOLN
INTRAVENOUS | Status: DC
  Administered 2014-10-24: 08:00:00 via INTRAVENOUS

## 2014-10-24 MED ORDER — ONDANSETRON HCL 4 MG/2ML IJ SOLN
INTRAMUSCULAR | Status: AC
  Filled 2014-10-24: qty 2

## 2014-10-24 MED ORDER — DOXYCYCLINE HYCLATE 100 MG PO TABS
200.0000 mg | ORAL_TABLET | Freq: Once | ORAL | Status: AC
  Administered 2014-10-24: 200 mg via ORAL

## 2014-10-24 MED ORDER — PROPOFOL 10 MG/ML IV BOLUS
INTRAVENOUS | Status: DC | PRN
  Administered 2014-10-24: 30 mg via INTRAVENOUS

## 2014-10-24 MED ORDER — CHLOROPROCAINE HCL 1 % IJ SOLN
INTRAMUSCULAR | Status: AC
  Filled 2014-10-24: qty 30

## 2014-10-24 MED ORDER — FENTANYL CITRATE 0.05 MG/ML IJ SOLN
INTRAMUSCULAR | Status: AC
  Filled 2014-10-24: qty 2

## 2014-10-24 MED ORDER — KETOROLAC TROMETHAMINE 30 MG/ML IJ SOLN: 30.0000 mg | Freq: Once | INTRAMUSCULAR | Status: DC | PRN

## 2014-10-24 MED ORDER — LIDOCAINE HCL (CARDIAC) 20 MG/ML IV SOLN
INTRAVENOUS | Status: DC | PRN
  Administered 2014-10-24: 80 mg via INTRAVENOUS

## 2014-10-24 MED ORDER — ACETAMINOPHEN 160 MG/5ML PO SOLN: 325.0000 mg | ORAL | Status: DC | PRN

## 2014-10-24 MED ORDER — MIDAZOLAM HCL 2 MG/2ML IJ SOLN: 0.5000 mg | Freq: Once | INTRAMUSCULAR | Status: DC | PRN

## 2014-10-24 MED ORDER — DEXAMETHASONE SODIUM PHOSPHATE 10 MG/ML IJ SOLN
INTRAMUSCULAR | Status: DC | PRN
  Administered 2014-10-24: 4 mg via INTRAVENOUS

## 2014-10-24 MED ORDER — LIDOCAINE HCL (CARDIAC) 20 MG/ML IV SOLN
INTRAVENOUS | Status: AC
  Filled 2014-10-24: qty 5

## 2014-10-24 MED ORDER — IBUPROFEN 600 MG PO TABS: 600.0000 mg | ORAL_TABLET | Freq: Four times a day (QID) | ORAL | Status: DC | PRN

## 2014-10-24 MED ORDER — PROMETHAZINE HCL 25 MG/ML IJ SOLN: 6.2500 mg | INTRAMUSCULAR | Status: DC | PRN

## 2014-10-24 MED ORDER — 0.9 % SODIUM CHLORIDE (POUR BTL) OPTIME
TOPICAL | Status: DC | PRN
  Administered 2014-10-24: 1000 mL

## 2014-10-24 SURGICAL SUPPLY — 18 items
CATH ROBINSON RED A/P 16FR (CATHETERS) ×2 IMPLANT
CLOTH BEACON ORANGE TIMEOUT ST (SAFETY) ×2 IMPLANT
DECANTER SPIKE VIAL GLASS SM (MISCELLANEOUS) ×2 IMPLANT
GLOVE BIO SURGEON STRL SZ 6 (GLOVE) ×2 IMPLANT
GLOVE BIOGEL PI IND STRL 6 (GLOVE) ×2 IMPLANT
GLOVE BIOGEL PI INDICATOR 6 (GLOVE) ×2
GOWN STRL REUS W/TWL LRG LVL3 (GOWN DISPOSABLE) ×4 IMPLANT
KIT BERKELEY 1ST TRIMESTER 3/8 (MISCELLANEOUS) ×2 IMPLANT
NS IRRIG 1000ML POUR BTL (IV SOLUTION) ×2 IMPLANT
PACK VAGINAL MINOR WOMEN LF (CUSTOM PROCEDURE TRAY) ×2 IMPLANT
PAD OB MATERNITY 4.3X12.25 (PERSONAL CARE ITEMS) ×2 IMPLANT
PAD PREP 24X48 CUFFED NSTRL (MISCELLANEOUS) ×2 IMPLANT
SET BERKELEY SUCTION TUBING (SUCTIONS) ×2 IMPLANT
TOWEL OR 17X24 6PK STRL BLUE (TOWEL DISPOSABLE) ×4 IMPLANT
VACURETTE 10 RIGID CVD (CANNULA) IMPLANT
VACURETTE 7MM CVD STRL WRAP (CANNULA) IMPLANT
VACURETTE 8 RIGID CVD (CANNULA) ×2 IMPLANT
VACURETTE 9 RIGID CVD (CANNULA) IMPLANT

## 2014-10-24 NOTE — Discharge Instructions (Signed)
Call MD for T>100.4, heavy vaginal bleeding, severe abdominal pain, intractable nausea and/or vomiting, or respiratory distress.  Follow up for 2 week postop appointment 11/07/14 at 10:10.  No driving while taking narcotics.  Pelvic rest x 2 weeks.DISCHARGE INSTRUCTIONS: D&C / D&E The following instructions have been prepared to help you care for yourself upon your return home.   Personal hygiene:  Use sanitary pads for vaginal drainage, not tampons.  Shower the day after your procedure.  NO tub baths, pools or Jacuzzis for 2-3 weeks.  Wipe front to back after using the bathroom.  Activity and limitations:  Do NOT drive or operate any equipment for 24 hours. The effects of anesthesia are still present and drowsiness may result.  Do NOT rest in bed all day.  Walking is encouraged.  Walk up and down stairs slowly.  You may resume your normal activity in one to two days or as indicated by your physician.  Sexual activity: NO intercourse for at least 2 weeks after the procedure, or as indicated by your physician.  Diet: Eat a light meal as desired this evening. You may resume your usual diet tomorrow.  Return to work: You may resume your work activities in one to two days or as indicated by your doctor.  What to expect after your surgery: Expect to have vaginal bleeding/discharge for 2-3 days and spotting for up to 10 days. It is not unusual to have soreness for up to 1-2 weeks. You may have a slight burning sensation when you urinate for the first day. Mild cramps may continue for a couple of days. You may have a regular period in 2-6 weeks.  Call your doctor for any of the following:  Excessive vaginal bleeding, saturating and changing one pad every hour.  Inability to urinate 6 hours after discharge from hospital.  Pain not relieved by pain medication.  Fever of 100.4 F or greater.  Unusual vaginal discharge or odor.   Call for an appointment:    Patients signature:  ______________________  Nurses signature ________________________  Support person's signature_______________________

## 2014-10-24 NOTE — Progress Notes (Signed)
No change to H&P. 

## 2014-10-24 NOTE — Transfer of Care (Signed)
Immediate Anesthesia Transfer of Care Note  Patient: Natalie BaumgartnerKeri B [name]  Procedure(s) Performed: Procedure(s) with comments: DILATATION AND EVACUATION with genetic studies (N/A) - genetic studies  Patient Location: PACU  Anesthesia Type:MAC  Level of Consciousness: awake, alert  and oriented  Airway & Oxygen Therapy: Patient Spontanous Breathing and Patient connected to nasal cannula oxygen  Post-op Assessment: Report given to PACU RN and Post -op Vital signs reviewed and stable  Post vital signs: Reviewed and stable  Complications: No apparent anesthesia complications

## 2014-10-24 NOTE — Anesthesia Preprocedure Evaluation (Signed)
Anesthesia Evaluation  Patient identified by MRN, date of birth, ID band Patient awake    Reviewed: Allergy & Precautions, H&P , Patient's Chart, lab work & pertinent test results, reviewed documented beta blocker date and time   History of Anesthesia Complications Negative for: history of anesthetic complications  Airway Mallampati: II TM Distance: >3 FB Neck ROM: full    Dental   Pulmonary  breath sounds clear to auscultation        Cardiovascular Exercise Tolerance: Good Rhythm:regular Rate:Normal     Neuro/Psych negative psych ROS   GI/Hepatic   Endo/Other    Renal/GU      Musculoskeletal   Abdominal   Peds  Hematology   Anesthesia Other Findings   Reproductive/Obstetrics                           Anesthesia Physical Anesthesia Plan  ASA: II  Anesthesia Plan: MAC   Post-op Pain Management:    Induction:   Airway Management Planned:   Additional Equipment:   Intra-op Plan:   Post-operative Plan:   Informed Consent: I have reviewed the patients History and Physical, chart, labs and discussed the procedure including the risks, benefits and alternatives for the proposed anesthesia with the patient or authorized representative who has indicated his/her understanding and acceptance.   Dental Advisory Given  Plan Discussed with: CRNA, Surgeon and Anesthesiologist  Anesthesia Plan Comments:         Anesthesia Quick Evaluation  

## 2014-10-24 NOTE — Op Note (Signed)
Patient: Natalie [name],Natalie [name] DOB: [birth year] PREOPERATIVE DIAGNOSIS: Missed abortion at 7 weeks  POSTOPERATIVE DIAGNOSIS: The same  PROCEDURE: Suction Dilation and Curettage with genetic studies.  SURGEON: Dr. Mitchel HonourMegan Aldina Porta  INDICATIONS: 29 y.o. G3P1 here for scheduled surgery for suction D&C for missed abortion at 7 weeks. Risks of surgery were discussed with the patient including but not limited to: bleeding which may require transfusion; infection which may require antibiotics; injury to uterus or surrounding organs; intrauterine scarring which may impair future fertility; need for additional procedures including laparotomy or laparoscopy; and other postoperative/anesthesia complications. Written informed consent was obtained.  FINDINGS: An 8 week size uterus.  ANESTHESIA: conscious sedation and local  ESTIMATED BLOOD LOSS: 75cc  SPECIMENS: Products of conception sent for genetic testing and pathology COMPLICATIONS: None immediate.  PROCEDURE DETAILS: The patient received intravenous antibiotics while in the preoperative area. She was then taken to the operating room where conscious sedation was administered. After an adequate timeout was performed, she was placed in the dorsal lithotomy position and examined; then prepped and draped in the sterile manner. Her bladder was catheterized for an unmeasured amount of clear, yellow urine. A speculum was then placed in the patient's vagina and cervical block was performed using 10cc of 1% nesacaine. a single tooth tenaculum was applied to the anterior lip of the cervix. The uteus was dilated manually with Pratt cervical dilators up to 24 [place]. Once the cervix was dilated, 8 mm suction curette was advanced and suction attached. Three suction curettage passes were performed which did remove tissue. A sharp curettage was then performed to obtain a scant amount of endometrial curettings and revealed a gritty texture circumferentially. The tenaculum was removed  from the anterior lip of the cervix and the vaginal speculum was removed after noting good hemostasis. The patient tolerated the procedure well and was taken to the recovery area awake, extubated and in stable condition. A single postoperative dose of doxycycline was ordered.

## 2014-10-25 ENCOUNTER — Encounter: Payer: Self-pay | Admitting: Family

## 2014-10-25 ENCOUNTER — Encounter (HOSPITAL_COMMUNITY): Payer: Self-pay | Admitting: Obstetrics & Gynecology

## 2014-10-30 ENCOUNTER — Ambulatory Visit (INDEPENDENT_AMBULATORY_CARE_PROVIDER_SITE_OTHER): Payer: PRIVATE HEALTH INSURANCE | Admitting: Licensed Clinical Social Worker

## 2014-10-30 DIAGNOSIS — F411 Generalized anxiety disorder: Secondary | ICD-10-CM

## 2014-10-30 DIAGNOSIS — F321 Major depressive disorder, single episode, moderate: Secondary | ICD-10-CM

## 2014-10-30 NOTE — Anesthesia Postprocedure Evaluation (Signed)
  Anesthesia Post-op Note  Patient: Natalie [name]  Procedure(s) Performed: Procedure(s) with comments: DILATATION AND EVACUATION with genetic studies (N/A) - genetic studies Patient is awake and responsive. Pain and nausea are reasonably well controlled. Vital signs are stable and clinically acceptable. Oxygen saturation is clinically acceptable. There are no apparent anesthetic complications at this time. Patient is ready for discharge.

## 2014-11-07 ENCOUNTER — Ambulatory Visit (INDEPENDENT_AMBULATORY_CARE_PROVIDER_SITE_OTHER): Payer: PRIVATE HEALTH INSURANCE | Admitting: Licensed Clinical Social Worker

## 2014-11-07 DIAGNOSIS — F411 Generalized anxiety disorder: Secondary | ICD-10-CM

## 2014-11-07 DIAGNOSIS — F321 Major depressive disorder, single episode, moderate: Secondary | ICD-10-CM

## 2014-11-26 LAB — CHROMOSOME STD, POC(TISSUE)-NCBH

## 2015-08-13 ENCOUNTER — Encounter: Payer: Self-pay | Admitting: Family

## 2016-06-11 ENCOUNTER — Other Ambulatory Visit (HOSPITAL_COMMUNITY): Payer: Self-pay | Admitting: Obstetrics & Gynecology

## 2016-06-11 DIAGNOSIS — O289 Unspecified abnormal findings on antenatal screening of mother: Secondary | ICD-10-CM

## 2016-06-11 DIAGNOSIS — Z87798 Personal history of other (corrected) congenital malformations: Secondary | ICD-10-CM

## 2016-06-11 DIAGNOSIS — Z3A12 12 weeks gestation of pregnancy: Secondary | ICD-10-CM

## 2016-06-12 ENCOUNTER — Telehealth (HOSPITAL_COMMUNITY): Payer: Self-pay | Admitting: MS"

## 2016-06-12 ENCOUNTER — Encounter (HOSPITAL_COMMUNITY): Payer: Self-pay

## 2016-06-12 ENCOUNTER — Ambulatory Visit (HOSPITAL_COMMUNITY)
Admission: RE | Admit: 2016-06-12 | Discharge: 2016-06-12 | Disposition: A | Discharge status: Home / Self Care | Payer: 59 | Source: Ambulatory Visit | Attending: Obstetrics & Gynecology | Admitting: Obstetrics & Gynecology

## 2016-06-12 ENCOUNTER — Other Ambulatory Visit (HOSPITAL_COMMUNITY): Payer: Self-pay | Admitting: Obstetrics & Gynecology

## 2016-06-12 DIAGNOSIS — O352XX0 Maternal care for (suspected) hereditary disease in fetus, not applicable or unspecified: Secondary | ICD-10-CM | POA: Insufficient documentation

## 2016-06-12 DIAGNOSIS — O289 Unspecified abnormal findings on antenatal screening of mother: Secondary | ICD-10-CM

## 2016-06-12 DIAGNOSIS — Z87798 Personal history of other (corrected) congenital malformations: Secondary | ICD-10-CM

## 2016-06-12 DIAGNOSIS — Z3A13 13 weeks gestation of pregnancy: Secondary | ICD-10-CM | POA: Diagnosis not present

## 2016-06-12 DIAGNOSIS — O358XX0 Maternal care for other (suspected) fetal abnormality and damage, not applicable or unspecified: Secondary | ICD-10-CM

## 2016-06-12 DIAGNOSIS — O283 Abnormal ultrasonic finding on antenatal screening of mother: Secondary | ICD-10-CM | POA: Insufficient documentation

## 2016-06-12 DIAGNOSIS — Z3A12 12 weeks gestation of pregnancy: Secondary | ICD-10-CM

## 2016-06-12 DIAGNOSIS — O35FXX0 Maternal care for other (suspected) fetal abnormality and damage, fetal musculoskeletal anomalies of trunk, not applicable or unspecified: Secondary | ICD-10-CM

## 2016-06-12 NOTE — Telephone Encounter (Signed)
Called patient to review options for termination of pregnancy. Relayed information that physician on call stated they were not able to facilitate procedure through them. [facility], and [facility]. She plans to call her insurance to inquire about coverage for the procedure. Discussed option of charity care application if not covered by insurance. Patient's primary OB is on vacation. She plans to leave a message with the OB office for OB to contact her when available as the patient feels it would be helpful to have her OB's perspective on availability of termination of pregnancy through her office.   Clydie BraunKaren Janaki Exley 06/12/2016 5:07 PM

## 2016-06-12 NOTE — Telephone Encounter (Signed)
Spoke with Dr. Elon SpannerLeger at [facility]. Patient's primary OB, Dr. Langston MaskerMorris is not in the office today, and Dr. Elon SpannerLeger is on call. Reviewed ultrasound findings from today's ultrasound with Dr. Elon SpannerLeger and the patient's desire to proceed with termination of pregnancy. Dr. Elon SpannerLeger planned to investigate the process through her group, given that she recently joined the practice and will plan to be in contact with the patient. Encouraged Dr. Elon SpannerLeger to call our office if we can assist as well.   Clydie BraunKaren Jesiah Grismer 06/12/2016 11:53 AM

## 2016-06-12 NOTE — Progress Notes (Signed)
MFM Note  I talked with Ms. Natalie ShoulderKeri [name] over the phone this afternoon. I provided the information required for informed consent under the [place] Women's Right to Know Act on September 1st at 6:20 PM.  All of her questions were addressed.

## 2016-06-16 DIAGNOSIS — IMO0002 Reserved for concepts with insufficient information to code with codable children: Secondary | ICD-10-CM | POA: Insufficient documentation

## 2016-06-17 ENCOUNTER — Encounter (HOSPITAL_COMMUNITY): Payer: Self-pay

## 2016-06-17 ENCOUNTER — Other Ambulatory Visit (HOSPITAL_COMMUNITY): Payer: Self-pay

## 2016-06-18 ENCOUNTER — Encounter (HOSPITAL_COMMUNITY): Payer: Self-pay | Admitting: MS"

## 2016-06-18 DIAGNOSIS — O358XX0 Maternal care for other (suspected) fetal abnormality and damage, not applicable or unspecified: Secondary | ICD-10-CM | POA: Insufficient documentation

## 2016-06-18 DIAGNOSIS — Z3A13 13 weeks gestation of pregnancy: Secondary | ICD-10-CM | POA: Insufficient documentation

## 2016-06-18 NOTE — Progress Notes (Signed)
Genetic Counseling  High-Risk Gestation Note  Appointment Date:  06/12/2016 Referred By: Linda Hedges, DO Date of Birth:  July 13, 1986 Partner:  Althia Forts   Pregnancy History: [id number] Estimated Date of Delivery: 12/18/16 Estimated Gestational Age: 26w0dAttending: MRenella Cunas MD    I met with Mrs. Natalie FAZEKASand her husband, [name] for genetic counseling because of abnormal ultrasound findings.  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 Natalie HUNTONwas sent for ultrasound and consultation today regarding abnormal ultrasound findings visualized by ultrasound at her OB office.  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 Complete ultrasound results reported under separate cover.     We discussed these findings in detail. We discussed that the constellation of findings is suggestive of OEIS complex (omphalocele, exstrophy of the cloaca, imperforate anus, and spine abnormalities), also referred to as cloacal exstrophy. OEIS occurs in approximately 1 in 100,000-400,000 births. Cloacal exstrophy is a severe multi-system congenital malformation involving defects of the genitourinary, gastrointestinal, musculoskeletal, and neurological systems. Major criteria for the complex on prenatal sonography include nonvisualization of urinary bladder, large midline infraumbilical abdominal wall defect or cystic anterior wall structure, omphalocele, and meningomyelocele. Minor criteria, occurring in less than 50% of cases include lower extremity defects, renal anomalies, single umbilical artery, ascites, narrow thorax, and hydrocephalus. Prognosis for cloacal exstrophy depends upon the severity of defect and associated malformations. Multiple reconstructive surgical procedures are required for management, particularly of the gastrointestinal and genitourinary tracts. Available studies have documented considerable morbidity and psychosocial consequences in individuals with a history of cloacal exstrophy/OEIS complex.   We discussed that congenital anomalies can occur as isolated, nonsyndromic birth defects, or as features of an underlying genetic syndrome.  The risk for a genetic etiology increases with the presence of multiple fetal anatomic differences.  We reviewed chromosomes, nondisjunction, and the common features of trisomy conditions.  In addition, we discussed the risk for other chromosome aberrations including microdeletions (22q11 del syndrome), duplications, insertions, and translocations. We discussed that rare cases of OEIS have been reported in the presence of a chromosome condition, such as trisomy 136or  microdeletion/microduplication. However, the majority of cases of OEIS typically occur sporadically.     Mrs. Natalie SPAIDwas then counseled regarding the availability of CVS and amniocentesis (at 15+ weeks gestation) for karyotype and chromosome microarray analysis including the associated risks, benefits, and limitations.  They  were then counseled regarding the option of noninvasive prenatal screening (NIPS).  We reviewed that this technology evaluates fragments of fetal (placental) DNA found in maternal circulation to predict the chance of specific chromosome conditions in the fetus.  Although highly specific and sensitive, this testing is not considered diagnostic.  We reviewed that NIPS specifically assesses the risk of fetal trisomies and select microdeletions (22q11 deletion syndrome); currently, this technology cannot assess the risk for all chromosome aberrations or single gene conditions. They were counseled that single gene conditions are typically tested for postnatally, based on the recommendation of a medical geneticist, unless ultrasound findings or the family history are strongly suggestive of a specific syndrome.  We discussed that multiple congenital anomalies can also result from teratogenic exposures.  Mrs. Natalie [name] denied the use of illicit substances, medications, or alcohol during this pregnancy. We discussed the possible results that the tests might provide including: positive, negative, unanticipated, and no result. Finally, they were counseled regarding the cost of each option and potential out of pocket expenses. After thoughtful consideration of their screening and testing options, Mrs. Natalie [name] declined NIPS and CVS/amniocentesis at this time.     We discussed the option of serial sonography, fetal echocardiogram, and a postnatal consultation with a medical geneticist, if warranted.  We also discussed the option of continuing the pregnancy versus termination of pregnancy.   We discussed that pregnancy termination is a legal option in [place] until [redacted] weeks gestation.  We discussed the options of D&E and induction.  The couple expressed that they plan to pursue termination of pregnancy via D&C. Dr. Burnett Harry provided information for informed consent under the [place] Women's Right to Know Act. They were also scheduled to meet with Dr. Jolyn Lent on 06/16/16 to further discuss pregnancy options.   Both family histories were reviewed and found to be contributory for two previous miscarriages for the couple. The first pregnancy together is their 25 year old son, who is healthy. 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 We reviewed that literature suggests that the risk of recurrence for a homo/heterotrisomy depends on the maternal age at the index pregnancy, the maternal age at the time of the subsequent pregnancy, and the specific trisomy (Warburton 2004).   While the family history is not significantly suggestive of an underlying chromosome rearrangement for Mr. or Mrs. Natalie [name], we discussed the option of peripheral blood chromosome analysis for each to rule out the presence of an underlying chromosome rearrangement (such as translocation or insertion) that would potentially predispose to chromosome imbalance in offspring. The couple declined peripheral blood chromosome analysis at this time but may consider pursing in the future.  Without further information regarding the provided family history, an accurate genetic risk cannot be calculated. Further genetic counseling is warranted if more information is obtained.  Mrs. Natalie [name] denied exposure to environmental toxins or chemical agents. She denied the use of alcohol, tobacco or street drugs. She denied significant viral illnesses during the course of her pregnancy. Her  medical and surgical histories were noncontributory.   I counseled this couple regarding the above risks and available options.  The approximate face-to-face time with the genetic counselor was 45 minutes.  Chipper Oman, MS,  Certified Genetic Counselor 06/18/2016

## 2016-09-16 ENCOUNTER — Encounter: Payer: Self-pay | Admitting: Adult Health

## 2016-09-16 ENCOUNTER — Ambulatory Visit (INDEPENDENT_AMBULATORY_CARE_PROVIDER_SITE_OTHER): Payer: 59 | Admitting: Adult Health

## 2016-09-16 VITALS — BP 138/72 | Temp 98.1°F | Ht 64.0 in | Wt 199.5 lb

## 2016-09-16 DIAGNOSIS — M545 Low back pain, unspecified: Secondary | ICD-10-CM

## 2016-09-16 MED ORDER — CYCLOBENZAPRINE HCL 10 MG PO TABS: 10.0000 mg | ORAL_TABLET | Freq: Three times a day (TID) | ORAL | 0 refills | Status: DC | PRN

## 2016-09-16 MED ORDER — METHYLPREDNISOLONE 4 MG PO TBPK: ORAL_TABLET | ORAL | 0 refills | Status: DC

## 2016-09-16 NOTE — Progress Notes (Signed)
Subjective:    Patient ID: Arty BaumgartnerKeri B Keithley, female    DOB: May 31, 1986, 30 y.o.   MRN: [id number]  Back Pain  This is a new problem. The current episode started in the past 7 days. The pain is present in the lumbar spine. The quality of the pain is described as aching. The pain does not radiate. The symptoms are aggravated by bending, lying down, sitting, twisting and standing. Stiffness is present all day. Pertinent negatives include no bladder incontinence, bowel incontinence, numbness, paresthesias, perianal numbness, tingling or weakness. She has tried ice for the symptoms. The treatment provided no relief.   Review of Systems  Respiratory: Negative.   Cardiovascular: Negative.   Gastrointestinal: Negative for bowel incontinence.  Genitourinary: Negative for bladder incontinence.  Musculoskeletal: Positive for back pain and myalgias. Negative for gait problem, neck pain and neck stiffness.  Neurological: Negative for tingling, weakness, numbness and paresthesias.   No past medical history on file.  Social History   Social History  . Marital status: Married    Spouse name: N/A  . Number of children: N/A  . Years of education: N/A   Occupational History  . Not on file.   Social History Main Topics  . Smoking status: Never Smoker  . Smokeless tobacco: Never Used  . Alcohol use Yes     Comment: occassionally  . Drug use: No  . Sexual activity: Not on file   Other Topics Concern  . Not on file   Social History Narrative  . No narrative on file    Past Surgical History:  Procedure Laterality Date  . BACK SURGERY    . DILATION AND EVACUATION N/A 04/30/2014   Procedure: DILATATION AND EVACUATION;  Surgeon: Philip AspenSidney Callahan, DO;  Location: [facility];  Service: Gynecology;  Laterality: N/A;  . DILATION AND EVACUATION N/A 10/24/2014   Procedure: DILATATION AND EVACUATION with genetic studies;  Surgeon: Mitchel HonourMegan Morris, DO;  Location: [facility];  Service: Gynecology;  Laterality: N/A;   genetic studies    Family History  Problem Relation Age of Onset  . Cancer Maternal Aunt     breast    No Known Allergies  No current outpatient prescriptions on file prior to visit.   No current facility-administered medications on file prior to visit.     BP 138/72   Temp 98.1 F (36.7 C) (Oral)   Ht 5\' 4"  (1.626 m)   Wt 199 lb 8 oz (90.5 kg)   BMI 34.24 kg/m       Objective:   Physical Exam  Constitutional: She is oriented to person, place, and time. She appears well-developed and well-nourished. No distress.  Cardiovascular: Normal rate, regular rhythm, normal heart sounds and intact distal pulses.  Exam reveals no gallop and no friction rub.   No murmur heard. Pulmonary/Chest: Effort normal and breath sounds normal. No respiratory distress. She has no wheezes. She has no rales. She exhibits no tenderness.  Musculoskeletal: Normal range of motion. She exhibits tenderness (lower back ( L2-L4) ). She exhibits no edema.  Neurological: She is alert and oriented to person, place, and time. She has normal reflexes. She displays normal reflexes. No cranial nerve deficit. She exhibits normal muscle tone. Coordination normal.  Skin: Skin is warm and dry. No rash noted. She is not diaphoretic. No erythema. No pallor.  Psychiatric: She has a normal mood and affect. Her behavior is normal. Judgment and thought content normal.  Nursing note and vitals reviewed.  Assessment & Plan:  1. Acute bilateral low back pain without sciatica - Appears as muscle strain. Will treat with prednisone and flexeril  - methylPREDNISolone (MEDROL DOSEPAK) 4 MG TBPK tablet; Take as directed  Dispense: 21 tablet; Refill: 0 - cyclobenzaprine (FLEXERIL) 10 MG tablet; Take 1 tablet (10 mg total) by mouth 3 (three) times daily as needed for muscle spasms.  Dispense: 30 tablet; Refill: 0 - Follow up if no improvement   Shirline Freesory Zarian Colpitts, NP

## 2016-09-30 ENCOUNTER — Ambulatory Visit (INDEPENDENT_AMBULATORY_CARE_PROVIDER_SITE_OTHER): Payer: 59 | Admitting: Primary Care

## 2016-09-30 ENCOUNTER — Encounter: Payer: Self-pay | Admitting: Primary Care

## 2016-09-30 VITALS — BP 122/82 | HR 79 | Temp 98.2°F | Ht 64.0 in | Wt 198.0 lb

## 2016-09-30 DIAGNOSIS — F32A Depression, unspecified: Secondary | ICD-10-CM | POA: Insufficient documentation

## 2016-09-30 DIAGNOSIS — E669 Obesity, unspecified: Secondary | ICD-10-CM

## 2016-09-30 DIAGNOSIS — R635 Abnormal weight gain: Secondary | ICD-10-CM | POA: Diagnosis not present

## 2016-09-30 DIAGNOSIS — F329 Major depressive disorder, single episode, unspecified: Secondary | ICD-10-CM | POA: Diagnosis not present

## 2016-09-30 DIAGNOSIS — E66811 Obesity, class 1: Secondary | ICD-10-CM | POA: Insufficient documentation

## 2016-09-30 NOTE — Patient Instructions (Addendum)
Complete lab work prior to leaving today. I will notify you of your results once received.   You will be contacted regarding your referral to the weight loss management clinic.  Please let us know if you have not heard back within one week.   Start exercising. You should be getting 150 minutes of moderate intensity exercise weekly.  Reduce consumption of sweets, sugary coffee drinks, processed food.  Ensure you are consuming 64 ounces of water daily.  It was a pleasure to meet you today! Please don't hesitate to call me with any questions. Welcome to [facility]!

## 2016-09-30 NOTE — Progress Notes (Signed)
Pre visit review using our clinic review tool, if applicable. No additional management support is needed unless otherwise documented below in the visit note. 

## 2016-09-30 NOTE — Assessment & Plan Note (Addendum)
Difficulty losing weight for years. Overall fair diet, but does not exercise. Discussed importance of increasing vegetables, fruit, exercise, water. Limit sweets and coffee drinks. Discussed that I do not prescribe phentermine. She is interested in a referral to [place]. Referral placed. Check TSH, A1C, and CMP today to rule out metabolic cause.

## 2016-09-30 NOTE — Assessment & Plan Note (Signed)
Overall doing well. History of miscarriages x 3. Meeting with therapist, nearly done with treatment.

## 2016-09-30 NOTE — Progress Notes (Signed)
Subjective:    Patient ID: Natalie [name], female    DOB: [birth year], 30 y.o.   MRN: [id number]  HPI  Ms. Natalie [name] is a 30 year old female who presents today to establish care and discuss the problems mentioned below. Will obtain old records.  1) Obesity: BMI of 33.99. Steady weight gain over the years. Over the past 1 month she's been working to improve her diet as she's joined [employer]. She's been successful on weight waters several times before. She has noticed symptoms of fatigue and difficulty losing weight over the years despite healthy lifestyle changes. She is interested in taking medication (Phentermine) as her friend has been successful on this medication.  She endorses a fair diet: Breakfast: Fruit, sugary coffee drinks (home made) Lunch: Meat, vegetable, starch Dinner: Meat, vegetable, starch Snacks: Popcorn, chips, candy Desserts: Daily Beverages: Water, occasional juice/soda, coffee  Exercise: She does not currently exercise.  2) Back Pain: History of discectomy to L4-5 in 2014. She was recently evaluated and treated at [facility] for complaints of lower back pain with radiculopathy. She was treated with Flexeril and a Medrol Dose Pak and has noticed significant improvement. She is nervous about this episode as she's had no problem since her surgery in 2014. She does admit to improper body posture and mechanics when lifting objects. Overall she's doing well.  3) Depression: History of miscarriages x 3. She is currently seeing a therapist who has helped her through. She's now seeing her therapist monthly and believes she is nearly complete. She's not managed on medications and overall feels she's recovered well.  Review of Systems  Constitutional: Positive for fatigue.  Respiratory: Negative for shortness of breath.   Cardiovascular: Negative for chest pain.  Endocrine: Negative for cold intolerance.  Musculoskeletal: Negative for back pain.  Neurological:  Negative for weakness.  Psychiatric/Behavioral: Negative for sleep disturbance. The patient is not nervous/anxious.        Overall feels well managed       No past medical history on file.   Social History   Social History  . Marital status: Married    Spouse name: N/A  . Number of children: N/A  . Years of education: N/A   Occupational History  . Not on file.   Social History Main Topics  . Smoking status: Never Smoker  . Smokeless tobacco: Never Used  . Alcohol use Yes     Comment: occassionally  . Drug use: No  . Sexual activity: Not on file   Other Topics Concern  . Not on file   Social History Narrative   Married.   1 child.   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    Past Surgical History:  Procedure Laterality Date  . BACK SURGERY    . DILATION AND EVACUATION N/A 04/30/2014   Procedure: DILATATION AND EVACUATION;  Surgeon: Philip AspenSidney Callahan, DO;  Location: [facility];  Service: Gynecology;  Laterality: N/A;  . DILATION AND EVACUATION N/A 10/24/2014   Procedure: DILATATION AND EVACUATION with genetic studies;  Surgeon: Mitchel HonourMegan Morris, DO;  Location: [facility];  Service: Gynecology;  Laterality: N/A;  genetic studies    Family History  Problem Relation Age of Onset  . Cancer Maternal Aunt     breast  . Hypertension Paternal Grandmother   . Diabetes Paternal Grandfather     No Known Allergies  No current outpatient prescriptions on file prior to visit.  No current facility-administered medications on file prior to visit.     BP 122/82   Pulse 79   Temp 98.2 F (36.8 C) (Oral)   Ht 5\' 4"  (1.626 m)   Wt 198 lb (89.8 kg)   SpO2 99%   BMI 33.99 kg/m    Objective:   Physical Exam  Constitutional: She appears well-nourished.  Neck: Neck supple. No thyromegaly present.  Cardiovascular: Normal rate and regular rhythm.   Pulmonary/Chest: Effort normal and breath sounds normal.  Musculoskeletal:  Negative  straight leg raise bilaterally  Skin: Skin is warm and dry.  Psychiatric: She has a normal mood and affect.          Assessment & Plan:  Low Back Pain:  History of discectomy in 2014. Treated 2 weeks ago for acute back pain. Overall feeling much improved. Discussed to remember good posture and body mechanics. If symptoms return, consider PT.  Morrie Sheldonlark,Katharin Schneider Kendal, NP

## 2016-10-01 LAB — COMPREHENSIVE METABOLIC PANEL
ALT: 20 U/L (ref 0–35)
AST: 19 U/L (ref 0–37)
Albumin: 4 g/dL (ref 3.5–5.2)
Alkaline Phosphatase: 63 U/L (ref 39–117)
BUN: 17 mg/dL (ref 6–23)
CO2: 30 meq/L (ref 19–32)
CREATININE: 0.83 mg/dL (ref 0.40–1.20)
Calcium: 9.2 mg/dL (ref 8.4–10.5)
Chloride: 105 mEq/L (ref 96–112)
GFR: 84.32 mL/min (ref >60.00)
GLUCOSE: 90 mg/dL (ref 70–99)
Potassium: 4 mEq/L (ref 3.5–5.1)
SODIUM: 140 meq/L (ref 135–145)
Total Bilirubin: 0.4 mg/dL (ref 0.2–1.2)
Total Protein: 7 g/dL (ref 6.0–8.3)

## 2016-10-01 LAB — HEMOGLOBIN A1C: Hgb A1c MFr Bld: 5.4 % (ref 4.6–6.5)

## 2016-10-01 LAB — TSH: TSH: 1.8 u[IU]/mL (ref 0.35–4.50)

## 2016-12-15 ENCOUNTER — Ambulatory Visit (INDEPENDENT_AMBULATORY_CARE_PROVIDER_SITE_OTHER): Payer: 59 | Admitting: Psychology

## 2016-12-15 DIAGNOSIS — F4323 Adjustment disorder with mixed anxiety and depressed mood: Secondary | ICD-10-CM | POA: Diagnosis not present

## 2017-07-27 DIAGNOSIS — Z8371 Family history of colonic polyps: Secondary | ICD-10-CM | POA: Diagnosis not present

## 2017-07-27 DIAGNOSIS — Z01419 Encounter for gynecological examination (general) (routine) without abnormal findings: Secondary | ICD-10-CM | POA: Diagnosis not present

## 2017-07-27 DIAGNOSIS — Z6829 Body mass index (BMI) 29.0-29.9, adult: Secondary | ICD-10-CM | POA: Diagnosis not present

## 2017-07-27 DIAGNOSIS — Z803 Family history of malignant neoplasm of breast: Secondary | ICD-10-CM | POA: Diagnosis not present

## 2017-10-19 DIAGNOSIS — Z809 Family history of malignant neoplasm, unspecified: Secondary | ICD-10-CM | POA: Diagnosis not present

## 2017-11-04 ENCOUNTER — Ambulatory Visit (INDEPENDENT_AMBULATORY_CARE_PROVIDER_SITE_OTHER): Payer: BLUE CROSS/BLUE SHIELD | Admitting: Primary Care

## 2017-11-04 VITALS — BP 116/74 | HR 67 | Temp 98.2°F | Ht 64.0 in | Wt 182.0 lb

## 2017-11-04 DIAGNOSIS — M549 Dorsalgia, unspecified: Secondary | ICD-10-CM

## 2017-11-04 DIAGNOSIS — M5441 Lumbago with sciatica, right side: Secondary | ICD-10-CM | POA: Diagnosis not present

## 2017-11-04 DIAGNOSIS — M5442 Lumbago with sciatica, left side: Secondary | ICD-10-CM | POA: Diagnosis not present

## 2017-11-04 DIAGNOSIS — M545 Low back pain: Secondary | ICD-10-CM

## 2017-11-04 DIAGNOSIS — G8929 Other chronic pain: Secondary | ICD-10-CM | POA: Diagnosis not present

## 2017-11-04 MED ORDER — PREDNISONE 20 MG PO TABS: ORAL_TABLET | ORAL | 0 refills | Status: DC

## 2017-11-04 NOTE — Progress Notes (Signed)
Subjective:    Patient ID: Natalie [name], female    DOB: July 28, 1986, 32 y.o.   MRN: [id number]  HPI  Ms. Sick is a 32 year old female who presents today with a chief complaint of back pain. Her pain is located to the bilateral lower back with radiation of pain, numbness/tingling down her bilateral lower extremities.   She underwent microdiscectomy in 2014 for bulging disc. Her symptoms began about 2 weeks ago which became worse 5 days ago. 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  She underwent an e-visit and was prescribed a 6 day course of a prednisone pack, took her last dose this morning. She felt immediately better after her first dose, then "over did it" with activity the following day and started to feel her symptoms again.   Her lower back extremity numbness/tingling and pain are intermittent and have improved. She denies loss of sensation, loss of bowel/bladder control, weakness.   Review of Systems  Genitourinary:       Denies loss of bowel and bladder control  Musculoskeletal: Positive for back pain. Negative for myalgias.  Neurological: Negative for weakness.       Tingling to lower extremities, intermittent.       No past medical history on file.   Social History   Socioeconomic History  . Marital status: Married    Spouse name: Not on file  . Number of children: Not on file  . Years of education: Not on file  . Highest education level: Not on file  Social Needs  . Financial resource strain: Not on file  . Food insecurity - worry: Not on file  . Food insecurity - inability: Not on file  . Transportation needs - medical: Not on file  . Transportation needs - non-medical: Not on file  Occupational History  . Not on file  Tobacco Use  . Smoking status: Never Smoker  . Smokeless tobacco: Never Used  Substance and Sexual Activity  . Alcohol use: Yes    Comment: occassionally  . Drug use:  No  . Sexual activity: Not on file  Other Topics Concern  . Not on file  Social History Narrative   Married.   1 child.   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    Past Surgical History:  Procedure Laterality Date  . BACK SURGERY    . DILATION AND EVACUATION N/A 04/30/2014   Procedure: DILATATION AND EVACUATION;  Surgeon: Philip Aspen, DO;  Location: [facility];  Service: Gynecology;  Laterality: N/A;  . DILATION AND EVACUATION N/A 10/24/2014   Procedure: DILATATION AND EVACUATION with genetic studies;  Surgeon: Mitchel Honour, DO;  Location: [facility];  Service: Gynecology;  Laterality: N/A;  genetic studies    Family History  Problem Relation Age of Onset  . Cancer Maternal Aunt        breast  . Hypertension Paternal Grandmother   . Diabetes Paternal Grandfather     No Known Allergies  Current Outpatient Medications on File Prior to Visit  Medication Sig Dispense Refill  . levonorgestrel (MIRENA) 20 MCG/24HR IUD by Intrauterine route.     No current facility-administered medications on file prior to visit.     BP 116/74   Pulse 67   Temp 98.2 F (36.8 C) (Oral)   Ht 5\' 4"  (1.626 m)   Wt 182 lb (82.6  kg)   SpO2 97%   BMI 31.24 kg/m    Objective:   Physical Exam  Constitutional: She appears well-nourished.  Musculoskeletal:       Lumbar back: She exhibits pain. She exhibits normal range of motion.       Back:  5/5 strength to bilateral lower extremities, negative straight leg raise. 2+ patellar reflexes  Skin: Skin is warm and dry.          Assessment & Plan:

## 2017-11-04 NOTE — Patient Instructions (Signed)
Start prednisone tablets. Take 2 tablets for 2 days then 1 tablet for 2 days.  Start stretching as discussed. Take a look at the information below.  Please notify me if your symptoms resume.  It was a pleasure to see you today!   Back Exercises The following exercises strengthen the muscles that help to support the back. They also help to keep the lower back flexible. Doing these exercises can help to prevent back pain or lessen existing pain. If you have back pain or discomfort, try doing these exercises 2-3 times each day or as told by your health care provider. When the pain goes away, do them once each day, but increase the number of times that you repeat the steps for each exercise (do more repetitions). If you do not have back pain or discomfort, do these exercises once each day or as told by your health care provider. Exercises Single Knee to Chest  Repeat these steps 3-5 times for each leg: 1. Lie on your back on a firm bed or the floor with your legs extended. 2. Bring one knee to your chest. Your other leg should stay extended and in contact with the floor. 3. Hold your knee in place by grabbing your knee or thigh. 4. Pull on your knee until you feel a gentle stretch in your lower back. 5. Hold the stretch for 10-30 seconds. 6. Slowly release and straighten your leg.  Pelvic Tilt  Repeat these steps 5-10 times: 1. Lie on your back on a firm bed or the floor with your legs extended. 2. Bend your knees so they are pointing toward the ceiling and your feet are flat on the floor. 3. Tighten your lower abdominal muscles to press your lower back against the floor. This motion will tilt your pelvis so your tailbone points up toward the ceiling instead of pointing to your feet or the floor. 4. With gentle tension and even breathing, hold this position for 5-10 seconds.  Cat-Cow  Repeat these steps until your lower back becomes more flexible: 1. Get into a hands-and-knees position  on a firm surface. Keep your hands under your shoulders, and keep your knees under your hips. You may place padding under your knees for comfort. 2. Let your head hang down, and point your tailbone toward the floor so your lower back becomes rounded like the back of a cat. 3. Hold this position for 5 seconds. 4. Slowly lift your head and point your tailbone up toward the ceiling so your back forms a sagging arch like the back of a cow. 5. Hold this position for 5 seconds.  Press-Ups  Repeat these steps 5-10 times: 1. Lie on your abdomen (face-down) on the floor. 2. Place your palms near your head, about shoulder-width apart. 3. While you keep your back as relaxed as possible and keep your hips on the floor, slowly straighten your arms to raise the top half of your body and lift your shoulders. Do not use your back muscles to raise your upper torso. You may adjust the placement of your hands to make yourself more comfortable. 4. Hold this position for 5 seconds while you keep your back relaxed. 5. Slowly return to lying flat on the floor.  Bridges  Repeat these steps 10 times: 1. Lie on your back on a firm surface. 2. Bend your knees so they are pointing toward the ceiling and your feet are flat on the floor. 3. Tighten your buttocks muscles and lift your  buttocks off of the floor until your waist is at almost the same height as your knees. You should feel the muscles working in your buttocks and the back of your thighs. If you do not feel these muscles, slide your feet 1-2 inches farther away from your buttocks. 4. Hold this position for 3-5 seconds. 5. Slowly lower your hips to the starting position, and allow your buttocks muscles to relax completely.  If this exercise is too easy, try doing it with your arms crossed over your chest. Abdominal Crunches  Repeat these steps 5-10 times: 1. Lie on your back on a firm bed or the floor with your legs extended. 2. Bend your knees so they are  pointing toward the ceiling and your feet are flat on the floor. 3. Cross your arms over your chest. 4. Tip your chin slightly toward your chest without bending your neck. 5. Tighten your abdominal muscles and slowly raise your trunk (torso) high enough to lift your shoulder blades a tiny bit off of the floor. Avoid raising your torso higher than that, because it can put too much stress on your low back and it does not help to strengthen your abdominal muscles. 6. Slowly return to your starting position.  Back Lifts Repeat these steps 5-10 times: 1. Lie on your abdomen (face-down) with your arms at your sides, and rest your forehead on the floor. 2. Tighten the muscles in your legs and your buttocks. 3. Slowly lift your chest off of the floor while you keep your hips pressed to the floor. Keep the back of your head in line with the curve in your back. Your eyes should be looking at the floor. 4. Hold this position for 3-5 seconds. 5. Slowly return to your starting position.  Contact a health care provider if:  Your back pain or discomfort gets much worse when you do an exercise.  Your back pain or discomfort does not lessen within 2 hours after you exercise. If you have any of these problems, stop doing these exercises right away. Do not do them again unless your health care provider says that you can. Get help right away if:  You develop sudden, severe back pain. If this happens, stop doing the exercises right away. Do not do them again unless your health care provider says that you can. This information is not intended to replace advice given to you by your health care provider. Make sure you discuss any questions you have with your health care provider. Document Released: 11/05/2004 Document Revised: 02/05/2016 Document Reviewed: 11/22/2014 Elsevier Interactive Patient Education  2017 [employer]

## 2017-11-04 NOTE — Assessment & Plan Note (Signed)
Acute on chronic flare that was likely secondary to recent overactivity. She is doing better which is a good sign, also exam is reassuring and there are no alarm signs. Will extend prednisone dose another 3-4 days as the 6 day course may have been too short.   Handout for back stretching provided, discussed to continue stretching daily regardless of flares.

## 2018-02-03 ENCOUNTER — Other Ambulatory Visit: Payer: Self-pay | Admitting: Primary Care

## 2018-02-03 DIAGNOSIS — Z0000 Encounter for general adult medical examination without abnormal findings: Secondary | ICD-10-CM

## 2018-02-07 ENCOUNTER — Other Ambulatory Visit (INDEPENDENT_AMBULATORY_CARE_PROVIDER_SITE_OTHER): Payer: BLUE CROSS/BLUE SHIELD

## 2018-02-07 DIAGNOSIS — Z0000 Encounter for general adult medical examination without abnormal findings: Secondary | ICD-10-CM

## 2018-02-07 LAB — COMPREHENSIVE METABOLIC PANEL
ALBUMIN: 4.2 g/dL (ref 3.5–5.2)
ALT: 43 U/L — ABNORMAL HIGH (ref 0–35)
AST: 27 U/L (ref 0–37)
Alkaline Phosphatase: 63 U/L (ref 39–117)
BUN: 13 mg/dL (ref 6–23)
CO2: 28 mEq/L (ref 19–32)
Calcium: 9.4 mg/dL (ref 8.4–10.5)
Chloride: 106 mEq/L (ref 96–112)
Creatinine, Ser: 0.78 mg/dL (ref 0.40–1.20)
GFR: 89.85 mL/min (ref >60.00)
Glucose, Bld: 87 mg/dL (ref 70–99)
POTASSIUM: 4.2 meq/L (ref 3.5–5.1)
Sodium: 140 mEq/L (ref 135–145)
TOTAL PROTEIN: 7.2 g/dL (ref 6.0–8.3)
Total Bilirubin: 0.6 mg/dL (ref 0.2–1.2)

## 2018-02-07 LAB — LIPID PANEL
CHOLESTEROL: 219 mg/dL — AB (ref 0–200)
HDL: 52.7 mg/dL (ref >39.00)
LDL Cholesterol: 152 mg/dL — ABNORMAL HIGH (ref 0–99)
NonHDL: 166.29
TRIGLYCERIDES: 72 mg/dL (ref 0.0–149.0)
Total CHOL/HDL Ratio: 4
VLDL: 14.4 mg/dL (ref 0.0–40.0)

## 2018-02-11 ENCOUNTER — Ambulatory Visit (INDEPENDENT_AMBULATORY_CARE_PROVIDER_SITE_OTHER): Payer: BLUE CROSS/BLUE SHIELD | Admitting: Primary Care

## 2018-02-11 VITALS — BP 116/70 | HR 82 | Temp 98.6°F | Ht 64.0 in | Wt 182.2 lb

## 2018-02-11 DIAGNOSIS — M545 Low back pain: Secondary | ICD-10-CM

## 2018-02-11 DIAGNOSIS — G8929 Other chronic pain: Secondary | ICD-10-CM

## 2018-02-11 DIAGNOSIS — Z0000 Encounter for general adult medical examination without abnormal findings: Secondary | ICD-10-CM

## 2018-02-11 DIAGNOSIS — F32A Depression, unspecified: Secondary | ICD-10-CM

## 2018-02-11 DIAGNOSIS — E785 Hyperlipidemia, unspecified: Secondary | ICD-10-CM | POA: Diagnosis not present

## 2018-02-11 DIAGNOSIS — F329 Major depressive disorder, single episode, unspecified: Secondary | ICD-10-CM | POA: Diagnosis not present

## 2018-02-11 NOTE — Assessment & Plan Note (Signed)
LDL of 152, HDL of 52.  Recommended regular exercise, continue to work on diet. Repeat in 6 months.

## 2018-02-11 NOTE — Assessment & Plan Note (Signed)
Immunizations UTD. Pap smear UTd per patient. Discussed the importance of a healthy diet and regular exercise in order for weight loss, and to reduce the risk of any potential medical problems. Exam unremarkable. Labs with hyperlipidemia, otherwise unremarkable. Follow up in 1 year for CPE,

## 2018-02-11 NOTE — Assessment & Plan Note (Signed)
Overall improved. Has been working on stretching exercises which has made the difference.

## 2018-02-11 NOTE — Progress Notes (Signed)
Subjective:    Patient ID: Natalie [name], female    DOB: 19-Jun-1986, 32 y.o.   MRN: [id number]  HPI  Natalie [name] is a 32 year old female who presents today for complete physical.   She also thinks she may be allergic to pineapple as she's developed upper abdominal pain, nausea, and sometimes vomiting each time after consuming pineapple. This doesn't occur with grilled pineapple, pineapple marinade, other fruits, other foods.   Immunizations: -Tetanus: Completed in 2015 -Influenza: Did not complete last season    Diet: She endorses a fair diet. Breakfast: Skips mostly, fruit Lunch: Left overs, chicken nuggets, tacos Dinner: Grilled protein, salads, vegetables, casserole, pasta  Snacks: Not recently  Desserts: Daily  Beverages: Coffee, water, some diet soda  Exercise: She is not currently exercising Eye exam: Completed in 2018 Dental exam: Follows regularly  Pap Smear: Follows annually   Review of Systems  Constitutional: Negative for unexpected weight change.  HENT: Negative for rhinorrhea.   Respiratory: Negative for cough and shortness of breath.   Cardiovascular: Negative for chest pain.  Gastrointestinal: Negative for constipation and diarrhea.  Genitourinary: Negative for difficulty urinating and menstrual problem.  Musculoskeletal: Negative for myalgias.       Right hip and lower back pain.   Skin: Negative for rash.  Allergic/Immunologic: Negative for environmental allergies.  Neurological: Negative for dizziness, numbness and headaches.  Psychiatric/Behavioral:       Overall doing well.     No past medical history on file.   Social History   Socioeconomic History  . Marital status: Married    Spouse name: Not on file  . Number of children: Not on file  . Years of education: Not on file  . Highest education level: Not on file  Occupational History  . Not on file  Social Needs  . Financial resource strain: Not on file  . Food insecurity:    Worry: Not  on file    Inability: Not on file  . Transportation needs:    Medical: Not on file    Non-medical: Not on file  Tobacco Use  . Smoking status: Never Smoker  . Smokeless tobacco: Never Used  Substance and Sexual Activity  . Alcohol use: Yes    Comment: occassionally  . Drug use: No  . Sexual activity: Not on file  Lifestyle  . Physical activity:    Days per week: Not on file    Minutes per session: Not on file  . Stress: Not on file  Relationships  . Social connections:    Talks on phone: Not on file    Gets together: Not on file    Attends religious service: Not on file    Active member of club or organization: Not on file    Attends meetings of clubs or organizations: Not on file    Relationship status: Not on file  . Intimate partner violence:    Fear of current or ex partner: Not on file    Emotionally abused: Not on file    Physically abused: Not on file    Forced sexual activity: Not on file  Other Topics Concern  . Not on file  Social History Narrative   Married.   1 child.   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    Past Surgical History:  Procedure Laterality Date  . BACK SURGERY    . DILATION AND EVACUATION N/A 04/30/2014  Procedure: DILATATION AND EVACUATION;  Surgeon: Philip Aspen, DO;  Location: [facility];  Service: Gynecology;  Laterality: N/A;  . DILATION AND EVACUATION N/A 10/24/2014   Procedure: DILATATION AND EVACUATION with genetic studies;  Surgeon: Mitchel Honour, DO;  Location: [facility];  Service: Gynecology;  Laterality: N/A;  genetic studies    Family History  Problem Relation Age of Onset  . Cancer Maternal Aunt        breast  . Hypertension Paternal Grandmother   . Diabetes Paternal Grandfather     No Known Allergies  Current Outpatient Medications on File Prior to Visit  Medication Sig Dispense Refill  . levonorgestrel (MIRENA) 20 MCG/24HR IUD by Intrauterine route.     No current  facility-administered medications on file prior to visit.     BP 116/70   Pulse 82   Temp 98.6 F (37 C) (Oral)   Ht  (1.626 m)   Wt 182 lb 4 oz (82.7 kg)   SpO2 98%   BMI 31.28 kg/m    Objective:   Physical Exam  Constitutional: She is oriented to person, place, and time. She appears well-nourished.  HENT:  Right Ear: Tympanic membrane and ear canal normal.  Left Ear: Tympanic membrane and ear canal normal.  Nose: Nose normal.  Mouth/Throat: Oropharynx is clear and moist.  Eyes: Pupils are equal, round, and reactive to light. Conjunctivae and EOM are normal.  Neck: Neck supple. No thyromegaly present.  Cardiovascular: Normal rate and regular rhythm.  No murmur heard. Pulmonary/Chest: Effort normal and breath sounds normal. She has no rales.  Abdominal: Soft. Bowel sounds are normal. There is no tenderness.  Musculoskeletal: Normal range of motion.  Lymphadenopathy:    She has no cervical adenopathy.  Neurological: She is alert and oriented to person, place, and time. She has normal reflexes. No cranial nerve deficit.  Skin: Skin is warm and dry. No rash noted.  Psychiatric: She has a normal mood and affect.          Assessment & Plan:

## 2018-02-11 NOTE — Assessment & Plan Note (Signed)
Doing well overall, manages well.

## 2018-02-11 NOTE — Patient Instructions (Addendum)
Start exercising. You should be getting 150 minutes of moderate intensity exercise weekly.  Continue to work on a healthy diet. Increase vegetables, fruit, whole grains, lean protein.  Ensure you are consuming 64 ounces of water daily.  Schedule a lab only appointment in 6 months for repeat cholesterol levels.   Follow up in 1 year for your annual exam or sooner if needed.  It was a pleasure to see you today!   Preventive Care 18-39 Years, Female Preventive care refers to lifestyle choices and visits with your health care provider that can promote health and wellness. What does preventive care include?  A yearly physical exam. This is also called an annual well check.  Dental exams once or twice a year.  Routine eye exams. Ask your health care provider how often you should have your eyes checked.  Personal lifestyle choices, including: ? Daily care of your teeth and gums. ? Regular physical activity. ? Eating a healthy diet. ? Avoiding tobacco and drug use. ? Limiting alcohol use. ? Practicing safe sex. ? Taking vitamin and mineral supplements as recommended by your health care provider. What happens during an annual well check? The services and screenings done by your health care provider during your annual well check will depend on your age, overall health, lifestyle risk factors, and family history of disease. Counseling Your health care provider may ask you questions about your:  Alcohol use.  Tobacco use.  Drug use.  Emotional well-being.  Home and relationship well-being.  Sexual activity.  Eating habits.  Work and work Statistician.  Method of birth control.  Menstrual cycle.  Pregnancy history.  Screening You may have the following tests or measurements:  Height, weight, and BMI.  Diabetes screening. This is done by checking your blood sugar (glucose) after you have not eaten for a while (fasting).  Blood pressure.  Lipid and cholesterol  levels. These may be checked every 5 years starting at age 69.  Skin check.  Hepatitis C blood test.  Hepatitis B blood test.  Sexually transmitted disease (STD) testing.  BRCA-related cancer screening. This may be done if you have a family history of breast, ovarian, tubal, or peritoneal cancers.  Pelvic exam and Pap test. This may be done every 3 years starting at age 63. Starting at age 75, this may be done every 5 years if you have a Pap test in combination with an HPV test.  Discuss your test results, treatment options, and if necessary, the need for more tests with your health care provider. Vaccines Your health care provider may recommend certain vaccines, such as:  Influenza vaccine. This is recommended every year.  Tetanus, diphtheria, and acellular pertussis (Tdap, Td) vaccine. You may need a Td booster every 10 years.  Varicella vaccine. You may need this if you have not been vaccinated.  HPV vaccine. If you are 66 or younger, you may need three doses over 6 months.  Measles, mumps, and rubella (MMR) vaccine. You may need at least one dose of MMR. You may also need a second dose.  Pneumococcal 13-valent conjugate (PCV13) vaccine. You may need this if you have certain conditions and were not previously vaccinated.  Pneumococcal polysaccharide (PPSV23) vaccine. You may need one or two doses if you smoke cigarettes or if you have certain conditions.  Meningococcal vaccine. One dose is recommended if you are age 9-21 years and a first-year college student living in a residence hall, or if you have one of several medical conditions.  You may also need additional booster doses.  Hepatitis A vaccine. You may need this if you have certain conditions or if you travel or work in places where you may be exposed to hepatitis A.  Hepatitis B vaccine. You may need this if you have certain conditions or if you travel or work in places where you may be exposed to hepatitis  B.  Haemophilus influenzae type b (Hib) vaccine. You may need this if you have certain risk factors.  Talk to your health care provider about which screenings and vaccines you need and how often you need them. This information is not intended to replace advice given to you by your health care provider. Make sure you discuss any questions you have with your health care provider. Document Released: 11/24/2001 Document Revised: 06/17/2016 Document Reviewed: 07/30/2015 Elsevier Interactive Patient Education  [employer].

## 2018-05-10 ENCOUNTER — Encounter: Payer: Self-pay | Admitting: Family Medicine

## 2018-05-10 ENCOUNTER — Ambulatory Visit (INDEPENDENT_AMBULATORY_CARE_PROVIDER_SITE_OTHER): Payer: BLUE CROSS/BLUE SHIELD | Admitting: Family Medicine

## 2018-05-10 VITALS — BP 114/82 | HR 96 | Temp 98.3°F | Ht 64.0 in | Wt 185.5 lb

## 2018-05-10 DIAGNOSIS — F32A Depression, unspecified: Secondary | ICD-10-CM | POA: Insufficient documentation

## 2018-05-10 DIAGNOSIS — F419 Anxiety disorder, unspecified: Secondary | ICD-10-CM

## 2018-05-10 DIAGNOSIS — F329 Major depressive disorder, single episode, unspecified: Secondary | ICD-10-CM | POA: Diagnosis not present

## 2018-05-10 MED ORDER — VENLAFAXINE HCL ER 37.5 MG PO CP24: 37.5000 mg | ORAL_CAPSULE | Freq: Every day | ORAL | 3 refills | Status: DC

## 2018-05-10 NOTE — Progress Notes (Signed)
Subjective:   Patient ID: Natalie BaumgartnerKeri B Durfey, female    DOB: 04/08/1986, 32 y.o.   MRN: [id number]  Natalie [name] is a pleasant 32 y.o. year old female who presents to clinic today with Anxiety (Patient is here today to discuss problems with anxiety.  She states that she has had issues with anxiety as far back as she can remember but it has become more than she can handle.  It has gotten to the point that it has affected several points in her life.  Plz see PHQ & GAD.  )  on 05/10/2018  HPI:  Patient is new to me.  Anxiety and depression- has been a rough few years for her.  Work is very stressful, multiple miscarriages.  Currently on mirena and they are not actively trying to have more children.  She wants to get her anxiety and depression under control before they consider whether or not they want to try to get pregnant again.  She feels work is not a good position for her- she does not like managing people or the confrontation involved in her job (she is an Chiropodistassistant director) but feels she cannot make a change right now.    Having more panic attacks, tearful.  Did see a therapist after one of her miscarriages and it was helpful.  Was on effexor over 8 years ago.  Stopped taking it when she was pregnant and never restarted it. It was effective.  Current Outpatient Medications on File Prior to Visit  Medication Sig Dispense Refill  . levonorgestrel (MIRENA) 20 MCG/24HR IUD by Intrauterine route.     No current facility-administered medications on file prior to visit.     No Known Allergies  No past medical history on file.  Past Surgical History:  Procedure Laterality Date  . BACK SURGERY    . DILATION AND EVACUATION N/A 04/30/2014   Procedure: DILATATION AND EVACUATION;  Surgeon: Philip AspenSidney Callahan, DO;  Location: [facility];  Service: Gynecology;  Laterality: N/A;  . DILATION AND EVACUATION N/A 10/24/2014   Procedure: DILATATION AND EVACUATION with genetic studies;  Surgeon: Mitchel HonourMegan Morris,  DO;  Location: [facility];  Service: Gynecology;  Laterality: N/A;  genetic studies    Family History  Problem Relation Age of Onset  . Cancer Maternal Aunt        breast  . Hypertension Paternal Grandmother   . Diabetes Paternal Grandfather     Social History   Socioeconomic History  . Marital status: Married    Spouse name: Not on file  . Number of children: Not on file  . Years of education: Not on file  . Highest education level: Not on file  Occupational History  . Not on file  Social Needs  . Financial resource strain: Not on file  . Food insecurity:    Worry: Not on file    Inability: Not on file  . Transportation needs:    Medical: Not on file    Non-medical: Not on file  Tobacco Use  . Smoking status: Never Smoker  . Smokeless tobacco: Never Used  Substance and Sexual Activity  . Alcohol use: Yes    Comment: occassionally  . Drug use: No  . Sexual activity: Not on file  Lifestyle  . Physical activity:    Days per week: Not on file    Minutes per session: Not on file  . Stress: Not on file  Relationships  . Social connections:    Talks on phone:  Not on file    Gets together: Not on file    Attends religious service: Not on file    Active member of club or organization: Not on file    Attends meetings of clubs or organizations: Not on file    Relationship status: Not on file  . Intimate partner violence:    Fear of current or ex partner: Not on file    Emotionally abused: Not on file    Physically abused: Not on file    Forced sexual activity: Not on file  Other Topics Concern  . Not on file  Social History Narrative   Married.   1 child.   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   The PMH, PSH, Social History, Family History, Medications, and allergies have been reviewed in [facility], and have been updated if relevant.   Review of Systems  Psychiatric/Behavioral: Positive for decreased concentration,  dysphoric mood and sleep disturbance. Negative for agitation, behavioral problems, confusion, hallucinations, self-injury and suicidal ideas. The patient is nervous/anxious. The patient is not hyperactive.   All other systems reviewed and are negative.      Objective:    BP 114/82 (BP Location: Left Arm, Patient Position: Sitting, Cuff Size: Normal)   Pulse 96   Temp 98.3 F (36.8 C) (Oral)   Ht 5\' 4"  (1.626 m)   Wt 185 lb 8 oz (84.1 kg)   SpO2 98%   BMI 31.84 kg/m    Physical Exam  Constitutional: She is oriented to person, place, and time. She appears well-developed and well-nourished. No distress.  HENT:  Head: Normocephalic and atraumatic.  Eyes: EOM are normal.  Neck: Normal range of motion.  Cardiovascular: Normal rate.  Pulmonary/Chest: Effort normal.  Neurological: She is alert and oriented to person, place, and time. No cranial nerve deficit.  Skin: Skin is warm and dry. She is not diaphoretic.  Psychiatric: Her speech is normal and behavior is normal. Judgment and thought content normal. Cognition and memory are normal.  tearful but appropriate.  Nursing note and vitals reviewed.         Assessment & Plan:   Anxiety and depression No follow-ups on file.

## 2018-05-10 NOTE — Assessment & Plan Note (Signed)
Deteriorated. PHQ and GAD screening both high today. >25 minutes spent in face to face time with patient, >50% spent in counselling or coordination of care Discussed tx options- we agreed to try effexor again as it has worked well for her in the past.  eRx sent for effexor 37.5 mg daily.  Also she is in agreement with psychotherapy- referral placed for psychotherapy at [facility].  It is a more convenient location for her. She will update me in a few weeks. The patient indicates understanding of these issues and agrees with the plan.

## 2018-05-10 NOTE — Patient Instructions (Addendum)
Great to meet you. We are starting effexor 37.5 mg every morning with breakfast. We are also referring [facility] to a therapist [facility].  Please change your lab appointment in November over to this office on your way out  Thank you :)

## 2018-06-07 ENCOUNTER — Ambulatory Visit: Payer: BLUE CROSS/BLUE SHIELD | Admitting: Psychology

## 2018-06-07 DIAGNOSIS — F410 Panic disorder [episodic paroxysmal anxiety] without agoraphobia: Secondary | ICD-10-CM | POA: Diagnosis not present

## 2018-06-15 ENCOUNTER — Ambulatory Visit: Payer: BLUE CROSS/BLUE SHIELD | Admitting: Psychology

## 2018-06-15 DIAGNOSIS — F410 Panic disorder [episodic paroxysmal anxiety] without agoraphobia: Secondary | ICD-10-CM

## 2018-06-22 ENCOUNTER — Ambulatory Visit: Payer: BLUE CROSS/BLUE SHIELD | Admitting: Psychology

## 2018-06-22 DIAGNOSIS — F410 Panic disorder [episodic paroxysmal anxiety] without agoraphobia: Secondary | ICD-10-CM | POA: Diagnosis not present

## 2018-06-28 ENCOUNTER — Ambulatory Visit: Payer: BLUE CROSS/BLUE SHIELD | Admitting: Psychology

## 2018-06-28 DIAGNOSIS — F410 Panic disorder [episodic paroxysmal anxiety] without agoraphobia: Secondary | ICD-10-CM | POA: Diagnosis not present

## 2018-07-06 ENCOUNTER — Ambulatory Visit: Payer: BLUE CROSS/BLUE SHIELD | Admitting: Psychology

## 2018-07-06 DIAGNOSIS — F410 Panic disorder [episodic paroxysmal anxiety] without agoraphobia: Secondary | ICD-10-CM | POA: Diagnosis not present

## 2018-07-13 ENCOUNTER — Ambulatory Visit: Payer: BLUE CROSS/BLUE SHIELD | Admitting: Psychology

## 2018-07-13 DIAGNOSIS — F410 Panic disorder [episodic paroxysmal anxiety] without agoraphobia: Secondary | ICD-10-CM | POA: Diagnosis not present

## 2018-07-20 ENCOUNTER — Ambulatory Visit: Payer: BLUE CROSS/BLUE SHIELD | Admitting: Psychology

## 2018-07-20 DIAGNOSIS — F410 Panic disorder [episodic paroxysmal anxiety] without agoraphobia: Secondary | ICD-10-CM | POA: Diagnosis not present

## 2018-08-01 DIAGNOSIS — Z6831 Body mass index (BMI) 31.0-31.9, adult: Secondary | ICD-10-CM | POA: Diagnosis not present

## 2018-08-01 DIAGNOSIS — Z01419 Encounter for gynecological examination (general) (routine) without abnormal findings: Secondary | ICD-10-CM | POA: Diagnosis not present

## 2018-08-02 ENCOUNTER — Ambulatory Visit: Payer: BLUE CROSS/BLUE SHIELD | Admitting: Psychology

## 2018-08-02 DIAGNOSIS — F410 Panic disorder [episodic paroxysmal anxiety] without agoraphobia: Secondary | ICD-10-CM | POA: Diagnosis not present

## 2018-08-03 ENCOUNTER — Other Ambulatory Visit: Payer: Self-pay | Admitting: Family Medicine

## 2018-08-03 DIAGNOSIS — F419 Anxiety disorder, unspecified: Principal | ICD-10-CM

## 2018-08-03 DIAGNOSIS — F329 Major depressive disorder, single episode, unspecified: Secondary | ICD-10-CM

## 2018-08-03 DIAGNOSIS — F32A Depression, unspecified: Secondary | ICD-10-CM

## 2018-08-04 NOTE — Telephone Encounter (Signed)
Will you call patient? She hasn't responded to my message. See my chart message.

## 2018-08-08 NOTE — Telephone Encounter (Signed)
Message left for patient to return my call.  

## 2018-08-09 NOTE — Telephone Encounter (Signed)
Pt called back. °

## 2018-08-10 ENCOUNTER — Ambulatory Visit (INDEPENDENT_AMBULATORY_CARE_PROVIDER_SITE_OTHER): Payer: BLUE CROSS/BLUE SHIELD | Admitting: Psychology

## 2018-08-10 DIAGNOSIS — F410 Panic disorder [episodic paroxysmal anxiety] without agoraphobia: Secondary | ICD-10-CM

## 2018-08-11 NOTE — Telephone Encounter (Signed)
Tried to call patient on 08/10/2018 and 08/11/2018. However, voicemail box is full.

## 2018-08-12 DIAGNOSIS — L0100 Impetigo, unspecified: Secondary | ICD-10-CM | POA: Diagnosis not present

## 2018-08-15 ENCOUNTER — Other Ambulatory Visit (INDEPENDENT_AMBULATORY_CARE_PROVIDER_SITE_OTHER): Payer: BLUE CROSS/BLUE SHIELD

## 2018-08-15 DIAGNOSIS — E785 Hyperlipidemia, unspecified: Secondary | ICD-10-CM | POA: Diagnosis not present

## 2018-08-15 LAB — LIPID PANEL
CHOL/HDL RATIO: 4
CHOLESTEROL: 195 mg/dL (ref 0–200)
HDL: 46.2 mg/dL (ref >39.00)
LDL CALC: 136 mg/dL — AB (ref 0–99)
NonHDL: 149.12
Triglycerides: 65 mg/dL (ref 0.0–149.0)
VLDL: 13 mg/dL (ref 0.0–40.0)

## 2018-08-15 NOTE — Telephone Encounter (Signed)
Natalie [name], it looks like patient viewed the message on MyChart on 08/08/2018 but no reply. I can't seem to get a hold of patient. Please advise.

## 2018-08-15 NOTE — Telephone Encounter (Signed)
Noted, will send another message and wait response.

## 2018-08-17 ENCOUNTER — Ambulatory Visit: Payer: BLUE CROSS/BLUE SHIELD | Admitting: Psychology

## 2018-08-17 DIAGNOSIS — F410 Panic disorder [episodic paroxysmal anxiety] without agoraphobia: Secondary | ICD-10-CM

## 2018-08-23 ENCOUNTER — Ambulatory Visit: Payer: BLUE CROSS/BLUE SHIELD | Admitting: Psychology

## 2018-08-23 DIAGNOSIS — F410 Panic disorder [episodic paroxysmal anxiety] without agoraphobia: Secondary | ICD-10-CM

## 2018-08-31 ENCOUNTER — Ambulatory Visit: Payer: BLUE CROSS/BLUE SHIELD | Admitting: Psychology

## 2018-08-31 DIAGNOSIS — F410 Panic disorder [episodic paroxysmal anxiety] without agoraphobia: Secondary | ICD-10-CM | POA: Diagnosis not present

## 2018-09-14 ENCOUNTER — Ambulatory Visit: Payer: BLUE CROSS/BLUE SHIELD | Admitting: Psychology

## 2018-09-14 DIAGNOSIS — F410 Panic disorder [episodic paroxysmal anxiety] without agoraphobia: Secondary | ICD-10-CM

## 2018-09-14 DIAGNOSIS — L0100 Impetigo, unspecified: Secondary | ICD-10-CM | POA: Diagnosis not present

## 2018-09-20 ENCOUNTER — Ambulatory Visit: Payer: BLUE CROSS/BLUE SHIELD | Admitting: Psychology

## 2018-09-20 DIAGNOSIS — F410 Panic disorder [episodic paroxysmal anxiety] without agoraphobia: Secondary | ICD-10-CM

## 2018-09-28 ENCOUNTER — Ambulatory Visit: Payer: BLUE CROSS/BLUE SHIELD | Admitting: Psychology

## 2018-09-28 DIAGNOSIS — F410 Panic disorder [episodic paroxysmal anxiety] without agoraphobia: Secondary | ICD-10-CM

## 2018-10-18 DIAGNOSIS — Z6831 Body mass index (BMI) 31.0-31.9, adult: Secondary | ICD-10-CM | POA: Diagnosis not present

## 2018-10-18 DIAGNOSIS — R6882 Decreased libido: Secondary | ICD-10-CM | POA: Diagnosis not present

## 2018-10-18 DIAGNOSIS — M6281 Muscle weakness (generalized): Secondary | ICD-10-CM | POA: Diagnosis not present

## 2018-10-18 DIAGNOSIS — M62838 Other muscle spasm: Secondary | ICD-10-CM | POA: Diagnosis not present

## 2018-10-18 DIAGNOSIS — N393 Stress incontinence (female) (male): Secondary | ICD-10-CM | POA: Diagnosis not present

## 2018-10-18 DIAGNOSIS — Z713 Dietary counseling and surveillance: Secondary | ICD-10-CM | POA: Diagnosis not present

## 2018-10-25 DIAGNOSIS — M6281 Muscle weakness (generalized): Secondary | ICD-10-CM | POA: Diagnosis not present

## 2018-10-25 DIAGNOSIS — N393 Stress incontinence (female) (male): Secondary | ICD-10-CM | POA: Diagnosis not present

## 2018-10-25 DIAGNOSIS — M62838 Other muscle spasm: Secondary | ICD-10-CM | POA: Diagnosis not present

## 2018-10-26 ENCOUNTER — Ambulatory Visit: Payer: BLUE CROSS/BLUE SHIELD | Admitting: Psychology

## 2018-11-01 DIAGNOSIS — M62838 Other muscle spasm: Secondary | ICD-10-CM | POA: Diagnosis not present

## 2018-11-01 DIAGNOSIS — N393 Stress incontinence (female) (male): Secondary | ICD-10-CM | POA: Diagnosis not present

## 2018-11-01 DIAGNOSIS — M6281 Muscle weakness (generalized): Secondary | ICD-10-CM | POA: Diagnosis not present

## 2018-11-15 DIAGNOSIS — M6281 Muscle weakness (generalized): Secondary | ICD-10-CM | POA: Diagnosis not present

## 2018-11-15 DIAGNOSIS — M62838 Other muscle spasm: Secondary | ICD-10-CM | POA: Diagnosis not present

## 2018-11-15 DIAGNOSIS — N393 Stress incontinence (female) (male): Secondary | ICD-10-CM | POA: Diagnosis not present

## 2018-11-16 ENCOUNTER — Ambulatory Visit: Payer: BLUE CROSS/BLUE SHIELD | Admitting: Psychology

## 2018-11-17 ENCOUNTER — Telehealth: Payer: Self-pay | Admitting: Primary Care

## 2018-11-17 DIAGNOSIS — Z20828 Contact with and (suspected) exposure to other viral communicable diseases: Secondary | ICD-10-CM

## 2018-11-17 NOTE — Telephone Encounter (Signed)
Pt called office stating her son has the flu. She wanted to know if Jae Dire could send in Tamiflu for herself so she does not get it. Pt uses [facility] in [place].

## 2018-11-17 NOTE — Telephone Encounter (Signed)
Yes, of course. Does she have any symptoms? Cough? Fevers? Chills? Body aches? We will get something sent in.

## 2018-11-18 MED ORDER — OSELTAMIVIR PHOSPHATE 75 MG PO CAPS: 75.0000 mg | ORAL_CAPSULE | Freq: Every day | ORAL | 0 refills | Status: DC

## 2018-11-18 NOTE — Addendum Note (Signed)
Addended by: Doreene Nest on: 11/18/2018 12:34 PM   Modules accepted: Orders

## 2018-11-18 NOTE — Telephone Encounter (Signed)
Please notify her that I sent in a prescription for Tamiflu. Take 1 capsule daily for 10 days.

## 2018-11-18 NOTE — Telephone Encounter (Signed)
Patient stated that she is overall okay but her son is coughing in her face a lot. She is concern that she may start getting sick.

## 2018-11-18 NOTE — Telephone Encounter (Signed)
Could not leave message for patient since voicemail.

## 2018-11-22 DIAGNOSIS — M62838 Other muscle spasm: Secondary | ICD-10-CM | POA: Diagnosis not present

## 2018-11-22 DIAGNOSIS — N393 Stress incontinence (female) (male): Secondary | ICD-10-CM | POA: Diagnosis not present

## 2018-11-22 DIAGNOSIS — M6281 Muscle weakness (generalized): Secondary | ICD-10-CM | POA: Diagnosis not present

## 2018-11-29 DIAGNOSIS — M62838 Other muscle spasm: Secondary | ICD-10-CM | POA: Diagnosis not present

## 2018-11-29 DIAGNOSIS — M6281 Muscle weakness (generalized): Secondary | ICD-10-CM | POA: Diagnosis not present

## 2018-11-29 DIAGNOSIS — N393 Stress incontinence (female) (male): Secondary | ICD-10-CM | POA: Diagnosis not present

## 2018-12-05 ENCOUNTER — Ambulatory Visit: Payer: BLUE CROSS/BLUE SHIELD | Admitting: Psychology

## 2018-12-05 DIAGNOSIS — F410 Panic disorder [episodic paroxysmal anxiety] without agoraphobia: Secondary | ICD-10-CM | POA: Diagnosis not present

## 2018-12-06 DIAGNOSIS — N393 Stress incontinence (female) (male): Secondary | ICD-10-CM | POA: Diagnosis not present

## 2018-12-06 DIAGNOSIS — M6281 Muscle weakness (generalized): Secondary | ICD-10-CM | POA: Diagnosis not present

## 2018-12-28 ENCOUNTER — Other Ambulatory Visit: Payer: Self-pay | Admitting: Primary Care

## 2018-12-28 DIAGNOSIS — F32A Depression, unspecified: Secondary | ICD-10-CM

## 2018-12-28 DIAGNOSIS — F419 Anxiety disorder, unspecified: Principal | ICD-10-CM

## 2018-12-28 DIAGNOSIS — F329 Major depressive disorder, single episode, unspecified: Secondary | ICD-10-CM

## 2018-12-29 ENCOUNTER — Other Ambulatory Visit: Payer: Self-pay

## 2018-12-29 DIAGNOSIS — F419 Anxiety disorder, unspecified: Principal | ICD-10-CM

## 2018-12-29 DIAGNOSIS — F329 Major depressive disorder, single episode, unspecified: Secondary | ICD-10-CM

## 2018-12-29 DIAGNOSIS — F32A Depression, unspecified: Secondary | ICD-10-CM

## 2018-12-29 MED ORDER — VENLAFAXINE HCL ER 37.5 MG PO CP24: 37.5000 mg | ORAL_CAPSULE | Freq: Every day | ORAL | 0 refills | Status: DC

## 2018-12-29 NOTE — Telephone Encounter (Signed)
Please notify patient that she will be due for CPE in May/June this year, please schedule if possible. Thanks!

## 2019-01-10 ENCOUNTER — Ambulatory Visit (INDEPENDENT_AMBULATORY_CARE_PROVIDER_SITE_OTHER): Payer: BLUE CROSS/BLUE SHIELD | Admitting: Psychology

## 2019-01-10 DIAGNOSIS — F410 Panic disorder [episodic paroxysmal anxiety] without agoraphobia: Secondary | ICD-10-CM | POA: Diagnosis not present

## 2019-01-25 ENCOUNTER — Ambulatory Visit (INDEPENDENT_AMBULATORY_CARE_PROVIDER_SITE_OTHER): Payer: BLUE CROSS/BLUE SHIELD | Admitting: Psychology

## 2019-01-25 DIAGNOSIS — F410 Panic disorder [episodic paroxysmal anxiety] without agoraphobia: Secondary | ICD-10-CM | POA: Diagnosis not present

## 2019-02-08 ENCOUNTER — Ambulatory Visit (INDEPENDENT_AMBULATORY_CARE_PROVIDER_SITE_OTHER): Payer: BLUE CROSS/BLUE SHIELD | Admitting: Psychology

## 2019-02-08 DIAGNOSIS — F410 Panic disorder [episodic paroxysmal anxiety] without agoraphobia: Secondary | ICD-10-CM | POA: Diagnosis not present

## 2019-02-22 ENCOUNTER — Ambulatory Visit (INDEPENDENT_AMBULATORY_CARE_PROVIDER_SITE_OTHER): Payer: BLUE CROSS/BLUE SHIELD | Admitting: Psychology

## 2019-02-22 DIAGNOSIS — F410 Panic disorder [episodic paroxysmal anxiety] without agoraphobia: Secondary | ICD-10-CM | POA: Diagnosis not present

## 2019-03-02 DIAGNOSIS — E669 Obesity, unspecified: Secondary | ICD-10-CM | POA: Diagnosis not present

## 2019-03-02 DIAGNOSIS — Z6829 Body mass index (BMI) 29.0-29.9, adult: Secondary | ICD-10-CM | POA: Diagnosis not present

## 2019-03-02 DIAGNOSIS — Z713 Dietary counseling and surveillance: Secondary | ICD-10-CM | POA: Diagnosis not present

## 2019-03-08 ENCOUNTER — Ambulatory Visit (INDEPENDENT_AMBULATORY_CARE_PROVIDER_SITE_OTHER): Payer: BLUE CROSS/BLUE SHIELD | Admitting: Psychology

## 2019-03-08 DIAGNOSIS — F410 Panic disorder [episodic paroxysmal anxiety] without agoraphobia: Secondary | ICD-10-CM | POA: Diagnosis not present

## 2019-03-21 ENCOUNTER — Ambulatory Visit: Payer: BLUE CROSS/BLUE SHIELD | Admitting: Psychology

## 2019-04-04 ENCOUNTER — Ambulatory Visit (INDEPENDENT_AMBULATORY_CARE_PROVIDER_SITE_OTHER): Payer: BC Managed Care – PPO | Admitting: Psychology

## 2019-04-04 DIAGNOSIS — F410 Panic disorder [episodic paroxysmal anxiety] without agoraphobia: Secondary | ICD-10-CM

## 2019-04-19 ENCOUNTER — Ambulatory Visit (INDEPENDENT_AMBULATORY_CARE_PROVIDER_SITE_OTHER): Payer: BC Managed Care – PPO | Admitting: Psychology

## 2019-04-19 DIAGNOSIS — F410 Panic disorder [episodic paroxysmal anxiety] without agoraphobia: Secondary | ICD-10-CM | POA: Diagnosis not present

## 2019-05-03 ENCOUNTER — Ambulatory Visit (INDEPENDENT_AMBULATORY_CARE_PROVIDER_SITE_OTHER): Payer: BC Managed Care – PPO | Admitting: Psychology

## 2019-05-03 DIAGNOSIS — F410 Panic disorder [episodic paroxysmal anxiety] without agoraphobia: Secondary | ICD-10-CM

## 2019-05-15 ENCOUNTER — Ambulatory Visit (INDEPENDENT_AMBULATORY_CARE_PROVIDER_SITE_OTHER): Payer: BLUE CROSS/BLUE SHIELD | Admitting: Primary Care

## 2019-05-15 DIAGNOSIS — G8929 Other chronic pain: Secondary | ICD-10-CM | POA: Diagnosis not present

## 2019-05-15 DIAGNOSIS — M545 Low back pain, unspecified: Secondary | ICD-10-CM

## 2019-05-15 MED ORDER — PREDNISONE 20 MG PO TABS: ORAL_TABLET | ORAL | 0 refills | Status: DC

## 2019-05-15 NOTE — Assessment & Plan Note (Signed)
Acute on chronic back pain flare. 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 No alarm signs. Given that our exam is overall limited and she's done well on prednisone in the past, we will repeat treatment with prednisone course.  If no improvement then consider xray/physical therapy. She agrees and will update.

## 2019-05-15 NOTE — Progress Notes (Signed)
Subjective:    Patient ID: Natalie [name], female    DOB: 1986/02/09, 33 y.o.   MRN: [id number]  HPI  Virtual Visit via Video Note  I connected with Natalie [name] on 05/15/19 at  2:20 PM EDT by a video enabled telemedicine application and verified that I am speaking with the correct person using two identifiers.  Location: Patient: Home Provider: Office   I discussed the limitations of evaluation and management by telemedicine and the availability of in person appointments. The patient expressed understanding and agreed to proceed.  History of Present Illness:  Natalie [name] is a 33 year old female with a history of chronic back pain who presents today with a chief complaint of back pain.  Her pain is located to the bilateral lower back with radiation to her thoracic back to the left shoulder blade. Her symptoms began several weeks ago. 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 Also increased stress at work due to limited staff.  She denies radiation of pain down her lower extremities, numbness/tinlging, injury/trauma. She's taken Ibuprofen and applied ice without much improvement. She describes her pain as a constant, dull pain with increased pain with prolonged standing and laying.   She had a flare like this in the past, was treated with prednisone that provided relief.    Observations/Objective:  Alert and oriented. Appears well, not sickly. No distress. Speaking in complete sentences.   Assessment and Plan:  Acute on chronic back pain flare. 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 No alarm signs. Given that our exam is overall limited and she's done well on prednisone in the past, we will repeat treatment with prednisone course.  If no improvement then consider xray/physical therapy. She agrees and will update.  Follow Up Instructions:  Start prednisone course. Take 2 tablets for  four days, then 1 tablet for four days.  Remember to stretch as discussed.  Avoid taking Ibuprofen with prednisone, okay to take Tylenol.  Please update me in 2 weeks if no improvement.  It was a pleasure to see you today!    I discussed the assessment and treatment plan with the patient. The patient was provided an opportunity to ask questions and all were answered. The patient agreed with the plan and demonstrated an understanding of the instructions.   The patient was advised to call back or seek an in-person evaluation if the symptoms worsen or if the condition fails to improve as anticipated.     Pleas Koch, NP    Review of Systems  Musculoskeletal: Positive for back pain.  Neurological: Negative for weakness and numbness.       No past medical history on file.   Social History   Socioeconomic History   Marital status: Married    Spouse name: Not on file   Number of children: Not on file   Years of education: Not on file   Highest education level: Not on file  Occupational History   Not on file  Social Needs   Financial resource strain: Not on file   Food insecurity    Worry: Not on file    Inability: Not on file   Transportation needs    Medical: Not on file    Non-medical: Not on file  Tobacco Use   Smoking status: Never Smoker   Smokeless tobacco: Never Used  Substance and Sexual Activity  Alcohol use: Yes    Comment: occassionally   Drug use: No   Sexual activity: Not on file  Lifestyle   Physical activity    Days per week: Not on file    Minutes per session: Not on file   Stress: Not on file  Relationships   Social connections    Talks on phone: Not on file    Gets together: Not on file    Attends religious service: Not on file    Active member of club or organization: Not on file    Attends meetings of clubs or organizations: Not on file    Relationship status: Not on file   Intimate partner violence    Fear of  current or ex partner: Not on file    Emotionally abused: Not on file    Physically abused: Not on file    Forced sexual activity: Not on file  Other Topics Concern   Not on file  Social History Narrative   Married.   1 child.   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    Past Surgical History:  Procedure Laterality Date   BACK SURGERY     DILATION AND EVACUATION N/A 04/30/2014   Procedure: DILATATION AND EVACUATION;  Surgeon: Philip AspenSidney Callahan, DO;  Location: [facility];  Service: Gynecology;  Laterality: N/A;   DILATION AND EVACUATION N/A 10/24/2014   Procedure: DILATATION AND EVACUATION with genetic studies;  Surgeon: Mitchel HonourMegan Morris, DO;  Location: [facility];  Service: Gynecology;  Laterality: N/A;  genetic studies    Family History  Problem Relation Age of Onset   Cancer Maternal Aunt        breast   Hypertension Paternal Grandmother    Diabetes Paternal Grandfather     No Known Allergies  Current Outpatient Medications on File Prior to Visit  Medication Sig Dispense Refill   levonorgestrel (MIRENA) 20 MCG/24HR IUD by Intrauterine route.     oseltamivir (TAMIFLU) 75 MG capsule Take 1 capsule (75 mg total) by mouth daily. For flu prevention. 10 capsule 0   venlafaxine XR (EFFEXOR-XR) 37.5 MG 24 hr capsule Take 1 capsule (37.5 mg total) by mouth daily with breakfast. 90 capsule 0   No current facility-administered medications on file prior to visit.     There were no vitals taken for this visit.   Objective:   Physical Exam  Constitutional: She is oriented to person, place, and time. She appears well-nourished.  Respiratory: Effort normal.  Musculoskeletal:     Comments: Grossly normal ROM to lumbar spine.  Neurological: She is alert and oriented to person, place, and time.  Psychiatric: She has a normal mood and affect.           Assessment & Plan:

## 2019-05-15 NOTE — Patient Instructions (Signed)
Start prednisone course. Take 2 tablets for four days, then 1 tablet for four days.  Remember to stretch as discussed.  Avoid taking Ibuprofen with prednisone, okay to take Tylenol.  Please update me in 2 weeks if no improvement.  It was a pleasure to see you today!

## 2019-05-18 ENCOUNTER — Ambulatory Visit (INDEPENDENT_AMBULATORY_CARE_PROVIDER_SITE_OTHER): Payer: BC Managed Care – PPO | Admitting: Psychology

## 2019-05-18 DIAGNOSIS — F411 Generalized anxiety disorder: Secondary | ICD-10-CM | POA: Diagnosis not present

## 2019-05-30 ENCOUNTER — Ambulatory Visit: Payer: BC Managed Care – PPO | Admitting: Psychology

## 2019-06-01 ENCOUNTER — Ambulatory Visit (INDEPENDENT_AMBULATORY_CARE_PROVIDER_SITE_OTHER): Payer: BC Managed Care – PPO | Admitting: Psychology

## 2019-06-01 DIAGNOSIS — F410 Panic disorder [episodic paroxysmal anxiety] without agoraphobia: Secondary | ICD-10-CM

## 2019-06-07 ENCOUNTER — Other Ambulatory Visit: Payer: Self-pay

## 2019-06-07 DIAGNOSIS — Z20822 Contact with and (suspected) exposure to covid-19: Secondary | ICD-10-CM

## 2019-06-07 DIAGNOSIS — R6889 Other general symptoms and signs: Secondary | ICD-10-CM | POA: Diagnosis not present

## 2019-06-08 LAB — NOVEL CORONAVIRUS, NAA: SARS-CoV-2, NAA: NOT DETECTED

## 2019-06-08 LAB — SPECIMEN STATUS REPORT

## 2019-06-15 ENCOUNTER — Ambulatory Visit (INDEPENDENT_AMBULATORY_CARE_PROVIDER_SITE_OTHER): Payer: BC Managed Care – PPO | Admitting: Psychology

## 2019-06-15 DIAGNOSIS — F410 Panic disorder [episodic paroxysmal anxiety] without agoraphobia: Secondary | ICD-10-CM | POA: Diagnosis not present

## 2019-06-22 ENCOUNTER — Other Ambulatory Visit: Payer: Self-pay | Admitting: Primary Care

## 2019-06-22 DIAGNOSIS — F32A Depression, unspecified: Secondary | ICD-10-CM

## 2019-06-22 DIAGNOSIS — F329 Major depressive disorder, single episode, unspecified: Secondary | ICD-10-CM

## 2019-06-27 ENCOUNTER — Ambulatory Visit (INDEPENDENT_AMBULATORY_CARE_PROVIDER_SITE_OTHER): Payer: BC Managed Care – PPO | Admitting: Psychology

## 2019-06-27 DIAGNOSIS — F410 Panic disorder [episodic paroxysmal anxiety] without agoraphobia: Secondary | ICD-10-CM | POA: Diagnosis not present

## 2019-07-11 ENCOUNTER — Ambulatory Visit (INDEPENDENT_AMBULATORY_CARE_PROVIDER_SITE_OTHER): Payer: BC Managed Care – PPO | Admitting: Psychology

## 2019-07-11 DIAGNOSIS — F410 Panic disorder [episodic paroxysmal anxiety] without agoraphobia: Secondary | ICD-10-CM

## 2019-07-25 ENCOUNTER — Ambulatory Visit (INDEPENDENT_AMBULATORY_CARE_PROVIDER_SITE_OTHER): Payer: BC Managed Care – PPO | Admitting: Psychology

## 2019-07-25 DIAGNOSIS — F410 Panic disorder [episodic paroxysmal anxiety] without agoraphobia: Secondary | ICD-10-CM

## 2019-08-08 ENCOUNTER — Ambulatory Visit (INDEPENDENT_AMBULATORY_CARE_PROVIDER_SITE_OTHER): Payer: BC Managed Care – PPO | Admitting: Psychology

## 2019-08-08 DIAGNOSIS — F410 Panic disorder [episodic paroxysmal anxiety] without agoraphobia: Secondary | ICD-10-CM | POA: Diagnosis not present

## 2019-08-29 ENCOUNTER — Ambulatory Visit (INDEPENDENT_AMBULATORY_CARE_PROVIDER_SITE_OTHER): Payer: BC Managed Care – PPO | Admitting: Psychology

## 2019-08-29 DIAGNOSIS — F410 Panic disorder [episodic paroxysmal anxiety] without agoraphobia: Secondary | ICD-10-CM

## 2019-09-11 ENCOUNTER — Ambulatory Visit (INDEPENDENT_AMBULATORY_CARE_PROVIDER_SITE_OTHER): Payer: BC Managed Care – PPO | Admitting: Psychology

## 2019-09-11 DIAGNOSIS — F410 Panic disorder [episodic paroxysmal anxiety] without agoraphobia: Secondary | ICD-10-CM | POA: Diagnosis not present

## 2019-09-20 ENCOUNTER — Encounter: Payer: Self-pay | Admitting: Primary Care

## 2019-09-20 ENCOUNTER — Other Ambulatory Visit: Payer: Self-pay

## 2019-09-20 ENCOUNTER — Ambulatory Visit (INDEPENDENT_AMBULATORY_CARE_PROVIDER_SITE_OTHER): Payer: PRIVATE HEALTH INSURANCE | Admitting: Primary Care

## 2019-09-20 VITALS — BP 118/82 | HR 82 | Temp 97.3°F | Ht 64.0 in | Wt 180.2 lb

## 2019-09-20 DIAGNOSIS — M545 Low back pain, unspecified: Secondary | ICD-10-CM

## 2019-09-20 DIAGNOSIS — G8929 Other chronic pain: Secondary | ICD-10-CM

## 2019-09-20 DIAGNOSIS — F32A Depression, unspecified: Secondary | ICD-10-CM

## 2019-09-20 DIAGNOSIS — Z0000 Encounter for general adult medical examination without abnormal findings: Secondary | ICD-10-CM | POA: Diagnosis not present

## 2019-09-20 DIAGNOSIS — E785 Hyperlipidemia, unspecified: Secondary | ICD-10-CM | POA: Diagnosis not present

## 2019-09-20 DIAGNOSIS — F419 Anxiety disorder, unspecified: Secondary | ICD-10-CM

## 2019-09-20 DIAGNOSIS — F329 Major depressive disorder, single episode, unspecified: Secondary | ICD-10-CM

## 2019-09-20 MED ORDER — VENLAFAXINE HCL ER 37.5 MG PO CP24: ORAL_CAPSULE | ORAL | 3 refills | Status: DC

## 2019-09-20 NOTE — Assessment & Plan Note (Signed)
Tetanus and influenza UTD. Pap smear UTD, follows with GYN. Encouraged a healthy diet and regular exercise. Exam today unremarkable. Labs pending.

## 2019-09-20 NOTE — Patient Instructions (Signed)
Stop by the lab prior to leaving today. I will notify you of your results once received.   Continue exercising. You should be getting 150 minutes of moderate intensity exercise weekly.  Continue to work on a healthy diet. Ensure you are consuming 64 ounces of water daily.  Continue venlafaxine ER. Please update me if you decide to discontinue.   It was a pleasure to see you today!   Preventive Care 21-35 Years Old, Female Preventive care refers to visits with your health care provider and lifestyle choices that can promote health and wellness. This includes:  A yearly physical exam. This may also be called an annual well check.  Regular dental visits and eye exams.  Immunizations.  Screening for certain conditions.  Healthy lifestyle choices, such as eating a healthy diet, getting regular exercise, not using drugs or products that contain nicotine and tobacco, and limiting alcohol use. What can I expect for my preventive care visit? Physical exam Your health care provider will check your:  Height and weight. This may be used to calculate body mass index (BMI), which tells if you are at a healthy weight.  Heart rate and blood pressure.  Skin for abnormal spots. Counseling Your health care provider may ask you questions about your:  Alcohol, tobacco, and drug use.  Emotional well-being.  Home and relationship well-being.  Sexual activity.  Eating habits.  Work and work Statistician.  Method of birth control.  Menstrual cycle.  Pregnancy history. What immunizations do I need?  Influenza (flu) vaccine  This is recommended every year. Tetanus, diphtheria, and pertussis (Tdap) vaccine  You may need a Td booster every 10 years. Varicella (chickenpox) vaccine  You may need this if you have not been vaccinated. Human papillomavirus (HPV) vaccine  If recommended by your health care provider, you may need three doses over 6 months. Measles, mumps, and rubella  (MMR) vaccine  You may need at least one dose of MMR. You may also need a second dose. Meningococcal conjugate (MenACWY) vaccine  One dose is recommended if you are age 26-21 years and a first-year college student living in a residence hall, or if you have one of several medical conditions. You may also need additional booster doses. Pneumococcal conjugate (PCV13) vaccine  You may need this if you have certain conditions and were not previously vaccinated. Pneumococcal polysaccharide (PPSV23) vaccine  You may need one or two doses if you smoke cigarettes or if you have certain conditions. Hepatitis A vaccine  You may need this if you have certain conditions or if you travel or work in places where you may be exposed to hepatitis A. Hepatitis B vaccine  You may need this if you have certain conditions or if you travel or work in places where you may be exposed to hepatitis B. Haemophilus influenzae type b (Hib) vaccine  You may need this if you have certain conditions. You may receive vaccines as individual doses or as more than one vaccine together in one shot (combination vaccines). Talk with your health care provider about the risks and benefits of combination vaccines. What tests do I need?  Blood tests  Lipid and cholesterol levels. These may be checked every 5 years starting at age 43.  Hepatitis C test.  Hepatitis B test. Screening  Diabetes screening. This is done by checking your blood sugar (glucose) after you have not eaten for a while (fasting).  Sexually transmitted disease (STD) testing.  BRCA-related cancer screening. This may be done  if you have a family history of breast, ovarian, tubal, or peritoneal cancers.  Pelvic exam and Pap test. This may be done every 3 years starting at age 26. Starting at age 16, this may be done every 5 years if you have a Pap test in combination with an HPV test. Talk with your health care provider about your test results, treatment  options, and if necessary, the need for more tests. Follow these instructions at home: Eating and drinking   Eat a diet that includes fresh fruits and vegetables, whole grains, lean protein, and low-fat dairy.  Take vitamin and mineral supplements as recommended by your health care provider.  Do not drink alcohol if: ? Your health care provider tells you not to drink. ? You are pregnant, may be pregnant, or are planning to become pregnant.  If you drink alcohol: ? Limit how much you have to 0-1 drink a day. ? Be aware of how much alcohol is in your drink. In the [place] one drink equals one 12 oz bottle of beer (355 mL), one 5 oz glass of wine (148 mL), or one 1 oz glass of hard liquor (44 mL). Lifestyle  Take daily care of your teeth and gums.  Stay active. Exercise for at least 30 minutes on 5 or more days each week.  Do not use any products that contain nicotine or tobacco, such as cigarettes, e-cigarettes, and chewing tobacco. If you need help quitting, ask your health care provider.  If you are sexually active, practice safe sex. Use a condom or other form of birth control (contraception) in order to prevent pregnancy and STIs (sexually transmitted infections). If you plan to become pregnant, see your health care provider for a preconception visit. What's next?  Visit your health care provider once a year for a well check visit.  Ask your health care provider how often you should have your eyes and teeth checked.  Stay up to date on all vaccines. This information is not intended to replace advice given to you by your health care provider. Make sure you discuss any questions you have with your health care provider. Document Released: 11/24/2001 Document Revised: 06/09/2018 Document Reviewed: 06/09/2018 Elsevier Patient Education  2020 [employer].

## 2019-09-20 NOTE — Assessment & Plan Note (Signed)
Commended her on lifestyle changes, encouraged to continue. Repeat lipids pending.

## 2019-09-20 NOTE — Assessment & Plan Note (Signed)
Improved with mild intermittent symptoms.  Offered PT for which she will think about. Using OTC products infrequently. Continue to monitor.

## 2019-09-20 NOTE — Progress Notes (Signed)
Subjective:    Patient ID: Natalie [name], female    DOB: 07/15/1986, 33 y.o.   MRN: [id number]  HPI  Natalie [name] is a 33 year old female who presents today for complete physical.  Immunizations: -Tetanus: Completed in 2015 -Influenza: Completed this season   Diet: She endorses a healthy diet, cooking more at home.  Exercise: She started exercising with her son  Eye exam: Scheduled for December 2020 Dental exam: Completes semi-annually   Pap Smear: UTD per patient, follows with GYN.   BP Readings from Last 3 Encounters:  09/20/19 118/82  05/10/18 114/82  02/11/18 116/70      Review of Systems  Constitutional: Negative for unexpected weight change.  HENT: Negative for rhinorrhea.   Respiratory: Negative for cough and shortness of breath.   Cardiovascular: Negative for chest pain.  Gastrointestinal: Negative for constipation and diarrhea.  Genitourinary: Negative for difficulty urinating and menstrual problem.  Musculoskeletal: Positive for arthralgias and back pain.  Skin: Negative for rash.  Allergic/Immunologic: Negative for environmental allergies.  Neurological: Negative for dizziness, numbness and headaches.  Psychiatric/Behavioral: The patient is not nervous/anxious.        No past medical history on file.   Social History   Socioeconomic History  . Marital status: Married    Spouse name: Not on file  . Number of children: Not on file  . Years of education: Not on file  . Highest education level: Not on file  Occupational History  . Not on file  Social Needs  . Financial resource strain: Not on file  . Food insecurity    Worry: Not on file    Inability: Not on file  . Transportation needs    Medical: Not on file    Non-medical: Not on file  Tobacco Use  . Smoking status: Never Smoker  . Smokeless tobacco: Never Used  Substance and Sexual Activity  . Alcohol use: Yes    Comment: occassionally  . Drug use: No  . Sexual activity: Not on file   Lifestyle  . Physical activity    Days per week: Not on file    Minutes per session: Not on file  . Stress: Not on file  Relationships  . Social Musician on phone: Not on file    Gets together: Not on file    Attends religious service: Not on file    Active member of club or organization: Not on file    Attends meetings of clubs or organizations: Not on file    Relationship status: Not on file  . Intimate partner violence    Fear of current or ex partner: Not on file    Emotionally abused: Not on file    Physically abused: Not on file    Forced sexual activity: Not on file  Other Topics Concern  . Not on file  Social History Narrative   Married.   1 child.   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    Past Surgical History:  Procedure Laterality Date  . BACK SURGERY    . DILATION AND EVACUATION N/A 04/30/2014   Procedure: DILATATION AND EVACUATION;  Surgeon: Philip Aspen, DO;  Location: [facility];  Service: Gynecology;  Laterality: N/A;  . DILATION AND EVACUATION N/A 10/24/2014   Procedure: DILATATION AND EVACUATION with genetic studies;  Surgeon: Mitchel Honour, DO;  Location: [facility];  Service: Gynecology;  Laterality: N/A;  genetic studies    Family History  Problem Relation Age of Onset  . Cancer Maternal Aunt        breast  . Hypertension Paternal Grandmother   . Diabetes Paternal Grandfather     No Known Allergies  Current Outpatient Medications on File Prior to Visit  Medication Sig Dispense Refill  . levonorgestrel (MIRENA) 20 MCG/24HR IUD by Intrauterine route.    . venlafaxine XR (EFFEXOR-XR) 37.5 MG 24 hr capsule TAKE 1 CAPSULE BY MOUTH DAILY WITH BREAKFAST. NEED APPOINTMENT FOR ANY MORE REFILLS. 90 capsule 0   No current facility-administered medications on file prior to visit.     BP 118/82   Pulse 82   Temp (!) 97.3 F (36.3 C) (Temporal)   Ht 5\' 4"  (1.626 m)   Wt 180 lb 4 oz (81.8 kg)    SpO2 98%   BMI 30.94 kg/m    Objective:   Physical Exam  Constitutional: She is oriented to person, place, and time. She appears well-nourished.  HENT:  Right Ear: Tympanic membrane and ear canal normal.  Left Ear: Tympanic membrane and ear canal normal.  Mouth/Throat: Oropharynx is clear and moist.  Eyes: Pupils are equal, round, and reactive to light. EOM are normal.  Neck: Neck supple.  Cardiovascular: Normal rate and regular rhythm.  Respiratory: Effort normal and breath sounds normal.  GI: Soft. Bowel sounds are normal. There is no abdominal tenderness.  Musculoskeletal: Normal range of motion.  Neurological: She is alert and oriented to person, place, and time. No cranial nerve deficit.  Reflex Scores:      Patellar reflexes are 2+ on the right side and 2+ on the left side. Skin: Skin is warm and dry.  Psychiatric: She has a normal mood and affect.           Assessment & Plan:

## 2019-09-20 NOTE — Assessment & Plan Note (Signed)
Doing well on Effexor, no recent panic attacks. She may consider weaning off Effexor as she is contemplating having another child. She will update.

## 2019-09-21 DIAGNOSIS — E785 Hyperlipidemia, unspecified: Secondary | ICD-10-CM

## 2019-09-21 LAB — LIPID PANEL
Cholesterol: 242 mg/dL — ABNORMAL HIGH (ref 0–200)
HDL: 59.9 mg/dL (ref >39.00)
LDL Cholesterol: 155 mg/dL — ABNORMAL HIGH (ref 0–99)
NonHDL: 181.69
Total CHOL/HDL Ratio: 4
Triglycerides: 133 mg/dL (ref 0.0–149.0)
VLDL: 26.6 mg/dL (ref 0.0–40.0)

## 2019-09-21 LAB — COMPREHENSIVE METABOLIC PANEL
ALT: 36 U/L — ABNORMAL HIGH (ref 0–35)
AST: 28 U/L (ref 0–37)
Albumin: 4.2 g/dL (ref 3.5–5.2)
Alkaline Phosphatase: 70 U/L (ref 39–117)
BUN: 13 mg/dL (ref 6–23)
CO2: 28 mEq/L (ref 19–32)
Calcium: 9.5 mg/dL (ref 8.4–10.5)
Chloride: 105 mEq/L (ref 96–112)
Creatinine, Ser: 0.81 mg/dL (ref 0.40–1.20)
GFR: 80.17 mL/min (ref >60.00)
Glucose, Bld: 79 mg/dL (ref 70–99)
Potassium: 4 mEq/L (ref 3.5–5.1)
Sodium: 140 mEq/L (ref 135–145)
Total Bilirubin: 0.4 mg/dL (ref 0.2–1.2)
Total Protein: 7.2 g/dL (ref 6.0–8.3)

## 2019-09-21 NOTE — Telephone Encounter (Signed)
Do patient need a follow up or lab appointment?

## 2019-10-03 ENCOUNTER — Ambulatory Visit (INDEPENDENT_AMBULATORY_CARE_PROVIDER_SITE_OTHER): Payer: Self-pay | Admitting: Psychology

## 2019-10-03 DIAGNOSIS — F410 Panic disorder [episodic paroxysmal anxiety] without agoraphobia: Secondary | ICD-10-CM

## 2019-11-02 ENCOUNTER — Ambulatory Visit (INDEPENDENT_AMBULATORY_CARE_PROVIDER_SITE_OTHER): Payer: BC Managed Care – PPO | Admitting: Psychology

## 2019-11-02 DIAGNOSIS — F411 Generalized anxiety disorder: Secondary | ICD-10-CM | POA: Diagnosis not present

## 2019-11-15 ENCOUNTER — Ambulatory Visit (INDEPENDENT_AMBULATORY_CARE_PROVIDER_SITE_OTHER): Payer: BC Managed Care – PPO | Admitting: Psychology

## 2019-11-15 DIAGNOSIS — F410 Panic disorder [episodic paroxysmal anxiety] without agoraphobia: Secondary | ICD-10-CM | POA: Diagnosis not present

## 2019-11-17 ENCOUNTER — Ambulatory Visit: Payer: Self-pay | Admitting: Psychology

## 2019-12-01 ENCOUNTER — Ambulatory Visit (INDEPENDENT_AMBULATORY_CARE_PROVIDER_SITE_OTHER): Payer: BC Managed Care – PPO | Admitting: Psychology

## 2019-12-01 DIAGNOSIS — F410 Panic disorder [episodic paroxysmal anxiety] without agoraphobia: Secondary | ICD-10-CM | POA: Diagnosis not present

## 2019-12-14 ENCOUNTER — Ambulatory Visit (INDEPENDENT_AMBULATORY_CARE_PROVIDER_SITE_OTHER): Payer: BC Managed Care – PPO | Admitting: Psychology

## 2019-12-14 DIAGNOSIS — F410 Panic disorder [episodic paroxysmal anxiety] without agoraphobia: Secondary | ICD-10-CM

## 2019-12-25 ENCOUNTER — Ambulatory Visit: Payer: BC Managed Care – PPO | Admitting: Adult Health

## 2019-12-25 ENCOUNTER — Other Ambulatory Visit: Payer: Self-pay

## 2019-12-25 ENCOUNTER — Ambulatory Visit (INDEPENDENT_AMBULATORY_CARE_PROVIDER_SITE_OTHER): Payer: BC Managed Care – PPO | Admitting: Adult Health

## 2019-12-25 ENCOUNTER — Encounter: Payer: Self-pay | Admitting: Adult Health

## 2019-12-25 VITALS — BP 122/75 | HR 94 | Ht 64.0 in | Wt 185.0 lb

## 2019-12-25 DIAGNOSIS — F411 Generalized anxiety disorder: Secondary | ICD-10-CM

## 2019-12-25 DIAGNOSIS — F331 Major depressive disorder, recurrent, moderate: Secondary | ICD-10-CM

## 2019-12-25 DIAGNOSIS — F909 Attention-deficit hyperactivity disorder, unspecified type: Secondary | ICD-10-CM | POA: Diagnosis not present

## 2019-12-25 DIAGNOSIS — Z9889 Other specified postprocedural states: Secondary | ICD-10-CM | POA: Insufficient documentation

## 2019-12-25 MED ORDER — AMPHETAMINE-DEXTROAMPHETAMINE 20 MG PO TABS: 20.0000 mg | ORAL_TABLET | Freq: Every day | ORAL | 0 refills | Status: DC

## 2019-12-25 NOTE — Progress Notes (Signed)
[facility] MD/PA/NP Initial Note  12/25/2019 11:56 AM Natalie [name]  MRN:  [id number]  Chief Complaint:  Chief Complaint    Anxiety; Depression      HPI:   Describes mood today as "ok". Pleasant. Mood symptoms - reports anxiety - "it's always there". Denies depression and irritability. Denies any panic attacks "lately". Reports "worrying" - "situational". Has difficulties letting things go. Has a "hard time" moving on from things. History of intrusive thoughts. Feels like Effexor has been helpful and would like to continue. Stable interest and motivation. Taking medications as prescribed.  Energy levels "not good lately". Active, does not have a regular exercise routine.  Enjoys some usual interests and activities. Married. Lives with husband of 11 years and son age 35. Family in [place] - "no one close by". Spending time with family. Appetite adequate. Weight gain - 20 pounds since last November. Sleeps well most nights. Averages 8 to 10 hours. Denies daytime napping. Focus and concentration difficulties. Starting and stopping projects - "I have 8 things going right now". Completing tasks. Managing aspects of household - "always a mess". Out of work currently. 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  Denies SI or HI. Denies AH or VH.  Previous medication trials: Effexor 37.5mg   Visit Diagnosis:    ICD-10-CM   1. Generalized anxiety disorder  F41.1   2. Major depressive disorder, recurrent episode, moderate (HCC)  F33.1   3. Attention deficit hyperactivity disorder (ADHD), unspecified ADHD type  F90.9     Past Psychiatric History: Denies psychiatric hospitalization.  Past Medical History: History reviewed. No pertinent past medical history.  Past Surgical History:  Procedure Laterality Date  . BACK SURGERY    . DILATION AND EVACUATION N/A 04/30/2014   Procedure: DILATATION AND EVACUATION;  Surgeon: Philip Aspen, DO;  Location: [facility];  Service:  Gynecology;  Laterality: N/A;  . DILATION AND EVACUATION N/A 10/24/2014   Procedure: DILATATION AND EVACUATION with genetic studies;  Surgeon: Mitchel Honour, DO;  Location: [facility];  Service: Gynecology;  Laterality: N/A;  genetic studies    Family Psychiatric History: Father's family with anxiety and depression.   Family History:  Family History  Problem Relation Age of Onset  . Cancer Maternal Aunt        breast  . Hypertension Paternal Grandmother   . Diabetes Paternal Grandfather     Social History:  Social History   Socioeconomic History  . Marital status: Married    Spouse name: Not on file  . Number of children: Not on file  . Years of education: Not on file  . Highest education level: Not on file  Occupational History  . Not on file  Tobacco Use  . Smoking status: Never Smoker  . Smokeless tobacco: Never Used  Substance and Sexual Activity  . Alcohol use: Yes    Comment: occassionally  . Drug use: No  . Sexual activity: Not on file  Other Topics Concern  . Not on file  Social History Narrative   Married.   1 child.   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   Social Determinants of Health   Financial Resource Strain:   . Difficulty of Paying Living Expenses:   Food Insecurity:   . Worried About Programme researcher, broadcasting/film/video in the Last Year:   . Barista in the Last Year:   Transportation Needs:   .  Lack of Transportation (Medical):   Marland Kitchen Lack of Transportation (Non-Medical):   Physical Activity:   . Days of Exercise per Week:   . Minutes of Exercise per Session:   Stress:   . Feeling of Stress :   Social Connections:   . Frequency of Communication with Friends and Family:   . Frequency of Social Gatherings with Friends and Family:   . Attends Religious Services:   . Active Member of Clubs or Organizations:   . Attends Archivist Meetings:   Marland Kitchen Marital Status:     Allergies: No Known  Allergies  Metabolic Disorder Labs: Lab Results  Component Value Date   HGBA1C 5.4 09/30/2016   No results found for: PROLACTIN Lab Results  Component Value Date   CHOL 242 (H) 09/20/2019   TRIG 133.0 09/20/2019   HDL 59.90 09/20/2019   CHOLHDL 4 09/20/2019   VLDL 26.6 09/20/2019   LDLCALC 155 (H) 09/20/2019   LDLCALC 136 (H) 08/15/2018   Lab Results  Component Value Date   TSH 1.80 09/30/2016   TSH 1.781 12/29/2013    Therapeutic Level Labs: No results found for: LITHIUM No results found for: VALPROATE No components found for:  CBMZ  Current Medications: Current Outpatient Medications  Medication Sig Dispense Refill  . levonorgestrel (MIRENA) 20 MCG/24HR IUD by Intrauterine route.    . phentermine (ADIPEX-P) 37.5 MG tablet Take 37.5 mg by mouth daily.    Marland Kitchen venlafaxine XR (EFFEXOR-XR) 37.5 MG 24 hr capsule TAKE 1 CAPSULE BY MOUTH DAILY WITH BREAKFAST for depression and anxiety. 90 capsule 3   No current facility-administered medications for this visit.    Medication Side Effects: none  Orders placed this visit:  No orders of the defined types were placed in this encounter.   Psychiatric Specialty Exam:  Review of Systems  Blood pressure 122/75, pulse 94, height 5\' 4"  (1.626 m), weight 185 lb (83.9 kg), unknown if currently breastfeeding.Body mass index is 31.76 kg/m.  General Appearance: Neat and Well Groomed  Eye Contact:  Good  Speech:  Clear and Coherent and Normal Rate  Volume:  Normal  Mood:  Euthymic  Affect:  Appropriate and Congruent  Thought Process:  Coherent and Descriptions of Associations: Intact  Orientation:  Full (Time, Place, and Person)  Thought Content: Logical   Suicidal Thoughts:  No  Homicidal Thoughts:  No  Memory:  WNL  Judgement:  Good  Insight:  Good  Psychomotor Activity:  Normal  Concentration:  Concentration: Good  Recall:  Good  Fund of Knowledge: Good  Language: Good  Assets:  Communication Skills Desire for  Improvement Financial Resources/Insurance Housing Intimacy Leisure Time Physical Health Resilience Social Support Talents/Skills Transportation Vocational/Educational  ADL's:  Intact  Cognition: WNL  Prognosis:  Good   Screenings:  GAD-7     Office Visit from 05/10/2018 in [facility]  Total GAD-7 Score  21    PHQ2-9     Office Visit from 05/10/2018 in [facility]  PHQ-2 Total Score  6  PHQ-9 Total Score  25      Receiving Psychotherapy: Yes   Treatment Plan/Recommendations:  Plan:  PDMP reviewed  1. Add Adderall 20mg  daily 2. Continue Effexor 37.5mg  daily  Connors Adult ADHD rating scales  Meets DSM criteria to diagnose ADHD  Read and reviewed note with patient for accuracy.   RTC 4 weeks  Patient advised to contact office with any questions, adverse effects, or acute worsening in signs and  symptoms.  Discussed potential benefits, risks, and side effects of stimulants with patient to include increased heart rate, palpitations, insomnia, increased anxiety, increased irritability, or decreased appetite.  Instructed patient to contact office if experiencing any significant tolerability issues.       Dorothyann Gibbs, NP

## 2019-12-28 ENCOUNTER — Ambulatory Visit (INDEPENDENT_AMBULATORY_CARE_PROVIDER_SITE_OTHER): Payer: BC Managed Care – PPO | Admitting: Psychology

## 2019-12-28 DIAGNOSIS — F411 Generalized anxiety disorder: Secondary | ICD-10-CM

## 2019-12-29 DIAGNOSIS — Z20828 Contact with and (suspected) exposure to other viral communicable diseases: Secondary | ICD-10-CM | POA: Diagnosis not present

## 2019-12-29 DIAGNOSIS — Z03818 Encounter for observation for suspected exposure to other biological agents ruled out: Secondary | ICD-10-CM | POA: Diagnosis not present

## 2020-01-11 ENCOUNTER — Ambulatory Visit (INDEPENDENT_AMBULATORY_CARE_PROVIDER_SITE_OTHER): Payer: BC Managed Care – PPO | Admitting: Psychology

## 2020-01-11 DIAGNOSIS — F410 Panic disorder [episodic paroxysmal anxiety] without agoraphobia: Secondary | ICD-10-CM

## 2020-01-23 ENCOUNTER — Encounter: Payer: Self-pay | Admitting: Adult Health

## 2020-01-23 ENCOUNTER — Ambulatory Visit (INDEPENDENT_AMBULATORY_CARE_PROVIDER_SITE_OTHER): Payer: BC Managed Care – PPO | Admitting: Adult Health

## 2020-01-23 ENCOUNTER — Other Ambulatory Visit: Payer: Self-pay

## 2020-01-23 DIAGNOSIS — F909 Attention-deficit hyperactivity disorder, unspecified type: Secondary | ICD-10-CM

## 2020-01-23 DIAGNOSIS — F422 Mixed obsessional thoughts and acts: Secondary | ICD-10-CM

## 2020-01-23 DIAGNOSIS — F331 Major depressive disorder, recurrent, moderate: Secondary | ICD-10-CM | POA: Diagnosis not present

## 2020-01-23 DIAGNOSIS — F411 Generalized anxiety disorder: Secondary | ICD-10-CM | POA: Diagnosis not present

## 2020-01-23 MED ORDER — FLUOXETINE HCL 10 MG PO CAPS: 10.0000 mg | ORAL_CAPSULE | Freq: Every day | ORAL | 1 refills | Status: DC

## 2020-01-23 MED ORDER — AMPHETAMINE-DEXTROAMPHET ER 20 MG PO CP24: 20.0000 mg | ORAL_CAPSULE | Freq: Every day | ORAL | 0 refills | Status: DC

## 2020-01-23 MED ORDER — AMPHETAMINE-DEXTROAMPHETAMINE 20 MG PO TABS: 20.0000 mg | ORAL_TABLET | Freq: Every day | ORAL | 0 refills | Status: DC

## 2020-01-23 NOTE — Progress Notes (Signed)
Natalie [name] [id number] January 19, 1986 34 y.o.  Subjective:   [name] is a 34 y.o. (DOB Apr 14, 1986) female.  Chief Complaint: No chief complaint on file.   HPI Natalie [name] presents to the office today for follow-up of MDD, GAD, and ADHD.   Describes mood today as "ok". Pleasant. Mood symptoms - reports decreased anxiety. Stating " - "it's no worse than it usually is". Denies depression and irritability. Denies any panic attacks. Decreased worry - "more situational stuff". Has a habit of picking at fingers. Stable interest and motivation. Taking medications as prescribed.  Energy levels stable. Active, does not have a regular exercise routine. Walking more at night. Riding bikes. Enjoys some usual interests and activities. Married. Lives with husband of 9 years and son age 30. Family in [place].  Appetite adequate. Weight loss - "a little". Not snacking as much.  Sleeps well most nights. Averages 8 to 10 hours. Denies daytime napping. Focus and concentration improved. More "productive".  Starting and finishing projects. Managing aspects of household. 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  Denies SI or HI. Denies AH or VH.  Previous medication trials: Effexor 37.5mg   GAD-7     Office Visit from 05/10/2018 in [facility]  Total GAD-7 Score  21    PHQ2-9     Office Visit from 05/10/2018 in [facility]  PHQ-2 Total Score  6  PHQ-9 Total Score  25       Review of Systems:  Review of Systems  Musculoskeletal: Negative for gait problem.  Neurological: Negative for tremors.  Psychiatric/Behavioral:       Please refer to HPI    Medications: I have reviewed the patient's current medications.  Current Outpatient Medications  Medication Sig Dispense Refill  . amphetamine-dextroamphetamine (ADDERALL XR) 20 MG 24 hr capsule Take 1 capsule (20 mg total) by mouth daily. 30 capsule 0  . amphetamine-dextroamphetamine (ADDERALL) 20  MG tablet Take 1 tablet (20 mg total) by mouth daily. 30 tablet 0  . FLUoxetine (PROZAC) 10 MG capsule Take 1 capsule (10 mg total) by mouth daily. 90 capsule 1  . levonorgestrel (MIRENA) 20 MCG/24HR IUD by Intrauterine route.    . venlafaxine XR (EFFEXOR-XR) 37.5 MG 24 hr capsule TAKE 1 CAPSULE BY MOUTH DAILY WITH BREAKFAST for depression and anxiety. 90 capsule 3   No current facility-administered medications for this visit.    Medication Side Effects: None  Allergies: No Known Allergies  No past medical history on file.  Family History  Problem Relation Age of Onset  . Cancer Maternal Aunt        breast  . Hypertension Paternal Grandmother   . Diabetes Paternal Grandfather     Social History   Socioeconomic History  . Marital status: Married    Spouse name: Not on file  . Number of children: Not on file  . Years of education: Not on file  . Highest education level: Not on file  Occupational History  . Not on file  Tobacco Use  . Smoking status: Never Smoker  . Smokeless tobacco: Never Used  Substance and Sexual Activity  . Alcohol use: Yes    Comment: occassionally  . Drug use: No  . Sexual activity: Not on file  Other Topics Concern  . Not on file  Social History Narrative   Married.   1 child.   Works as an Surveyor, quantity at a childcare center.   Enjoys  spending time with family.   Social Determinants of Health   Financial Resource Strain:   . Difficulty of Paying Living Expenses:   Food Insecurity:   . Worried About Programme researcher, broadcasting/film/video in the Last Year:   . Barista in the Last Year:   Transportation Needs:   . Freight forwarder (Medical):   Marland Kitchen Lack of Transportation (Non-Medical):   Physical Activity:   . Days of Exercise per Week:   . Minutes of Exercise per Session:   Stress:   . Feeling of Stress :   Social Connections:   . Frequency of Communication with Friends and Family:   . Frequency of Social Gatherings with Friends and  Family:   . Attends Religious Services:   . Active Member of Clubs or Organizations:   . Attends Banker Meetings:   Marland Kitchen Marital Status:   Intimate Partner Violence:   . Fear of Current or Ex-Partner:   . Emotionally Abused:   Marland Kitchen Physically Abused:   . Sexually Abused:     Past Medical History, Surgical history, Social history, and Family history were reviewed and updated as appropriate.   Please see review of systems for further details on the patient's review from today.   Objective:   Physical Exam:  There were no vitals taken for this visit.  Physical Exam Constitutional:      General: She is not in acute distress. Musculoskeletal:        General: No deformity.  Neurological:     Mental Status: She is alert and oriented to person, place, and time.     Coordination: Coordination normal.  Psychiatric:        Attention and Perception: Attention and perception normal. She does not perceive auditory or visual hallucinations.        Mood and Affect: Mood normal. Mood is not anxious or depressed. Affect is not labile, blunt, angry or inappropriate.        Speech: Speech normal.        Behavior: Behavior normal.        Thought Content: Thought content normal. Thought content is not paranoid or delusional. Thought content does not include homicidal or suicidal ideation. Thought content does not include homicidal or suicidal plan.        Cognition and Memory: Cognition and memory normal.        Judgment: Judgment normal.     Comments: Insight intact     Lab Review:     Component Value Date/Time   NA 140 09/20/2019 1510   K 4.0 09/20/2019 1510   CL 105 09/20/2019 1510   CO2 28 09/20/2019 1510   GLUCOSE 79 09/20/2019 1510   BUN 13 09/20/2019 1510   CREATININE 0.81 09/20/2019 1510   CREATININE 0.73 12/29/2013 1652   CREATININE 0.73 12/29/2013 1652   CALCIUM 9.5 09/20/2019 1510   PROT 7.2 09/20/2019 1510   ALBUMIN 4.2 09/20/2019 1510   AST 28 09/20/2019 1510    ALT 36 (H) 09/20/2019 1510   ALKPHOS 70 09/20/2019 1510   BILITOT 0.4 09/20/2019 1510       Component Value Date/Time   WBC 8.5 10/24/2014 0703   RBC 4.16 10/24/2014 0703   HGB 12.9 10/24/2014 0703   HCT 37.6 10/24/2014 0703   PLT 190 10/24/2014 0703   MCV 90.4 10/24/2014 0703   MCH 31.0 10/24/2014 0703   MCHC 34.3 10/24/2014 0703   RDW 13.1 10/24/2014 0703   LYMPHSABS  1.6 12/05/2012 0812   MONOABS 0.6 12/05/2012 0812   EOSABS 0.2 12/05/2012 0812   BASOSABS 0.0 12/05/2012 0812    No results found for: POCLITH, LITHIUM   No results found for: PHENYTOIN, PHENOBARB, VALPROATE, CBMZ   .res Assessment: Plan:    Plan:  PDMP reviewed  1. Continue Adderall 20mg  daily 2. Stop Effexor 37.5mg  daily - side effects 3. Add Prozac 10mg  daily 4. Add Adderall XR 20mg  daily  Connors Adult ADHD rating scales  Meets DSM criteria to diagnose ADHD  Read and reviewed note with patient for accuracy.   RTC 4 weeks  Patient advised to contact office with any questions, adverse effects, or acute worsening in signs and symptoms.  Discussed potential benefits, risks, and side effects of stimulants with patient to include increased heart rate, palpitations, insomnia, increased anxiety, increased irritability, or decreased appetite.  Instructed patient to contact office if experiencing any significant tolerability issues.  Diagnoses and all orders for this visit:  Attention deficit hyperactivity disorder (ADHD), unspecified ADHD type -     amphetamine-dextroamphetamine (ADDERALL) 20 MG tablet; Take 1 tablet (20 mg total) by mouth daily. -     amphetamine-dextroamphetamine (ADDERALL XR) 20 MG 24 hr capsule; Take 1 capsule (20 mg total) by mouth daily.  Major depressive disorder, recurrent episode, moderate (HCC) -     FLUoxetine (PROZAC) 10 MG capsule; Take 1 capsule (10 mg total) by mouth daily.  Generalized anxiety disorder -     FLUoxetine (PROZAC) 10 MG capsule; Take 1 capsule (10 mg  total) by mouth daily.  Mixed obsessional thoughts and acts     Please see After Visit Summary for patient specific instructions.  Future Appointments  Date Time Provider Department Center  01/25/2020 11:00 AM [facility] None  02/08/2020 11:00 AM [facility] None    No orders of the defined types were placed in this encounter.   -------------------------------

## 2020-01-24 ENCOUNTER — Other Ambulatory Visit: Payer: Self-pay | Admitting: Adult Health

## 2020-01-24 DIAGNOSIS — F909 Attention-deficit hyperactivity disorder, unspecified type: Secondary | ICD-10-CM

## 2020-01-24 MED ORDER — ADDERALL XR 20 MG PO CP24: 20.0000 mg | ORAL_CAPSULE | Freq: Every day | ORAL | 0 refills | Status: DC

## 2020-01-25 ENCOUNTER — Ambulatory Visit (INDEPENDENT_AMBULATORY_CARE_PROVIDER_SITE_OTHER): Payer: BC Managed Care – PPO | Admitting: Psychology

## 2020-01-25 DIAGNOSIS — F410 Panic disorder [episodic paroxysmal anxiety] without agoraphobia: Secondary | ICD-10-CM

## 2020-02-08 ENCOUNTER — Ambulatory Visit (INDEPENDENT_AMBULATORY_CARE_PROVIDER_SITE_OTHER): Payer: BC Managed Care – PPO | Admitting: Psychology

## 2020-02-08 DIAGNOSIS — F410 Panic disorder [episodic paroxysmal anxiety] without agoraphobia: Secondary | ICD-10-CM

## 2020-02-20 ENCOUNTER — Ambulatory Visit: Payer: BC Managed Care – PPO | Admitting: Adult Health

## 2020-02-22 ENCOUNTER — Ambulatory Visit (INDEPENDENT_AMBULATORY_CARE_PROVIDER_SITE_OTHER): Payer: BC Managed Care – PPO | Admitting: Psychology

## 2020-02-22 DIAGNOSIS — F410 Panic disorder [episodic paroxysmal anxiety] without agoraphobia: Secondary | ICD-10-CM | POA: Diagnosis not present

## 2020-02-29 ENCOUNTER — Ambulatory Visit (INDEPENDENT_AMBULATORY_CARE_PROVIDER_SITE_OTHER): Payer: BC Managed Care – PPO | Admitting: Adult Health

## 2020-02-29 ENCOUNTER — Other Ambulatory Visit: Payer: Self-pay

## 2020-02-29 ENCOUNTER — Encounter: Payer: Self-pay | Admitting: Adult Health

## 2020-02-29 DIAGNOSIS — F909 Attention-deficit hyperactivity disorder, unspecified type: Secondary | ICD-10-CM | POA: Diagnosis not present

## 2020-02-29 MED ORDER — AMPHETAMINE-DEXTROAMPHET ER 30 MG PO CP24: 30.0000 mg | ORAL_CAPSULE | Freq: Every day | ORAL | 0 refills | Status: DC

## 2020-02-29 NOTE — Progress Notes (Signed)
Natalie [name] [id number] 1986-10-08 34 y.o.  Subjective:   [name] is a 34 y.o. (DOB 08/30/1986) female.  Chief Complaint: No chief complaint on file.   HPI LYDIAH PONG presents to the office today for follow-up of MDD, GAD, and ADHD.  Describes mood today as "ok". Pleasant. Mood symptoms - denies depression, anxiety, and irritability. Denies any panic attacks. Decreased worry - "no more than what's normalf". Continues to pick at fingers - "not as bad". Stable interest and motivation. Taking medications as prescribed.  Energy levels stable. Active, does not have a regular exercise routine. Walking. Riding bike. Working in the yard.  Enjoys some usual interests and activities. Married. Lives with husband of 11 years and son age 10. Family in [place].  Appetite adequate. Weight stable. Sleeps well most nights. Averages 8 hours. Denies daytime napping. Focus and concentration improved. Completing tasks. Managing aspects of household. 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  Denies SI or HI. Denies AH or VH.  Previous medication trials: Effexor 37.5mg    GAD-7     Office Visit from 05/10/2018 in [facility]  Total GAD-7 Score  21    PHQ2-9     Office Visit from 05/10/2018 in [facility]  PHQ-2 Total Score  6  PHQ-9 Total Score  25       Review of Systems:  Review of Systems  Musculoskeletal: Negative for gait problem.  Neurological: Negative for tremors.  Psychiatric/Behavioral:       Please refer to HPI    Medications: I have reviewed the patient's current medications.  Current Outpatient Medications  Medication Sig Dispense Refill  . amphetamine-dextroamphetamine (ADDERALL XR) 30 MG 24 hr capsule Take 1 capsule (30 mg total) by mouth daily. 30 capsule 0  . levonorgestrel (MIRENA) 20 MCG/24HR IUD by Intrauterine route.    . venlafaxine XR (EFFEXOR-XR) 37.5 MG 24 hr capsule TAKE 1 CAPSULE BY MOUTH DAILY WITH  BREAKFAST for depression and anxiety. 90 capsule 3   No current facility-administered medications for this visit.    Medication Side Effects: None  Allergies: No Known Allergies  No past medical history on file.  Family History  Problem Relation Age of Onset  . Cancer Maternal Aunt        breast  . Hypertension Paternal Grandmother   . Diabetes Paternal Grandfather     Social History   Socioeconomic History  . Marital status: Married    Spouse name: Not on file  . Number of children: Not on file  . Years of education: Not on file  . Highest education level: Not on file  Occupational History  . Not on file  Tobacco Use  . Smoking status: Never Smoker  . Smokeless tobacco: Never Used  Substance and Sexual Activity  . Alcohol use: Yes    Comment: occassionally  . Drug use: No  . Sexual activity: Not on file  Other Topics Concern  . Not on file  Social History Narrative   Married.   1 child.   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   Social Determinants of Health   Financial Resource Strain:   . Difficulty of Paying Living Expenses:   Food Insecurity:   . Worried About Programme researcher, broadcasting/film/video in the Last Year:   . Barista in the Last Year:   Transportation Needs:   .  Lack of Transportation (Medical):   Marland Kitchen Lack of Transportation (Non-Medical):   Physical Activity:   . Days of Exercise per Week:   . Minutes of Exercise per Session:   Stress:   . Feeling of Stress :   Social Connections:   . Frequency of Communication with Friends and Family:   . Frequency of Social Gatherings with Friends and Family:   . Attends Religious Services:   . Active Member of Clubs or Organizations:   . Attends Banker Meetings:   Marland Kitchen Marital Status:   Intimate Partner Violence:   . Fear of Current or Ex-Partner:   . Emotionally Abused:   Marland Kitchen Physically Abused:   . Sexually Abused:     Past Medical History,  Surgical history, Social history, and Family history were reviewed and updated as appropriate.   Please see review of systems for further details on the patient's review from today.   Objective:   Physical Exam:  There were no vitals taken for this visit.  Physical Exam Constitutional:      General: She is not in acute distress. Musculoskeletal:        General: No deformity.  Neurological:     Mental Status: She is alert and oriented to person, place, and time.     Coordination: Coordination normal.  Psychiatric:        Attention and Perception: Attention and perception normal. She does not perceive auditory or visual hallucinations.        Mood and Affect: Mood normal. Mood is not anxious or depressed. Affect is not labile, blunt, angry or inappropriate.        Speech: Speech normal.        Behavior: Behavior normal.        Thought Content: Thought content normal. Thought content is not paranoid or delusional. Thought content does not include homicidal or suicidal ideation. Thought content does not include homicidal or suicidal plan.        Cognition and Memory: Cognition and memory normal.        Judgment: Judgment normal.     Comments: Insight intact     Lab Review:     Component Value Date/Time   NA 140 09/20/2019 1510   K 4.0 09/20/2019 1510   CL 105 09/20/2019 1510   CO2 28 09/20/2019 1510   GLUCOSE 79 09/20/2019 1510   BUN 13 09/20/2019 1510   CREATININE 0.81 09/20/2019 1510   CREATININE 0.73 12/29/2013 1652   CREATININE 0.73 12/29/2013 1652   CALCIUM 9.5 09/20/2019 1510   PROT 7.2 09/20/2019 1510   ALBUMIN 4.2 09/20/2019 1510   AST 28 09/20/2019 1510   ALT 36 (H) 09/20/2019 1510   ALKPHOS 70 09/20/2019 1510   BILITOT 0.4 09/20/2019 1510       Component Value Date/Time   WBC 8.5 10/24/2014 0703   RBC 4.16 10/24/2014 0703   HGB 12.9 10/24/2014 0703   HCT 37.6 10/24/2014 0703   PLT 190 10/24/2014 0703   MCV 90.4 10/24/2014 0703   MCH 31.0 10/24/2014 0703    MCHC 34.3 10/24/2014 0703   RDW 13.1 10/24/2014 0703   LYMPHSABS 1.6 12/05/2012 0812   MONOABS 0.6 12/05/2012 0812   EOSABS 0.2 12/05/2012 0812   BASOSABS 0.0 12/05/2012 0812    No results found for: POCLITH, LITHIUM   No results found for: PHENYTOIN, PHENOBARB, VALPROATE, CBMZ   .res Assessment: Plan:    Plan:  PDMP reviewed  1. D/C Adderall 20mg  daily  2. Continue Effexor 37.5mg  daily - side effects 3. Did not start Prozac 10mg  daily 4. Increase Adderall XR 20mg  to 30mg  daily  Connors Adult ADHD rating scales  Meets DSM criteria to diagnose ADHD  RTC 4 weeks  Patient advised to contact office with any questions, adverse effects, or acute worsening in signs and symptoms.  Discussed potential benefits, risks, and side effects of stimulants with patient to include increased heart rate, palpitations, insomnia, increased anxiety, increased irritability, or decreased appetite.  Instructed patient to contact office if experiencing any significant tolerability issues.   Diagnoses and all orders for this visit:  Attention deficit hyperactivity disorder (ADHD), unspecified ADHD type -     amphetamine-dextroamphetamine (ADDERALL XR) 30 MG 24 hr capsule; Take 1 capsule (30 mg total) by mouth daily.     Please see After Visit Summary for patient specific instructions.  Future Appointments  Date Time Provider [place]  03/07/2020 11:00 AM [facility] None    No orders of the defined types were placed in this encounter.   -------------------------------

## 2020-03-07 ENCOUNTER — Ambulatory Visit (INDEPENDENT_AMBULATORY_CARE_PROVIDER_SITE_OTHER): Payer: BC Managed Care – PPO | Admitting: Psychology

## 2020-03-07 DIAGNOSIS — F410 Panic disorder [episodic paroxysmal anxiety] without agoraphobia: Secondary | ICD-10-CM | POA: Diagnosis not present

## 2020-03-13 ENCOUNTER — Telehealth: Payer: Self-pay | Admitting: Adult Health

## 2020-03-13 ENCOUNTER — Other Ambulatory Visit: Payer: Self-pay

## 2020-03-13 DIAGNOSIS — F909 Attention-deficit hyperactivity disorder, unspecified type: Secondary | ICD-10-CM

## 2020-03-13 MED ORDER — AMPHETAMINE-DEXTROAMPHET ER 20 MG PO CP24: 20.0000 mg | ORAL_CAPSULE | Freq: Every day | ORAL | 0 refills | Status: DC

## 2020-03-13 NOTE — Telephone Encounter (Signed)
Noted  

## 2020-03-13 NOTE — Telephone Encounter (Signed)
Natalie [name] called because you had recently increased her Adderall to 30mg .  It is really upsetting her stomach.  She doesn't feel she can tolerate this strength.  It is too strong.  Can she reduce back to 20mg ?  Please call to advice.  If ok, will need new prescription for the 20mg .  [facility] [place], 

## 2020-03-13 NOTE — Telephone Encounter (Signed)
Can we get more details? She can go back to 20mg .

## 2020-03-21 ENCOUNTER — Ambulatory Visit (INDEPENDENT_AMBULATORY_CARE_PROVIDER_SITE_OTHER): Payer: BC Managed Care – PPO | Admitting: Psychology

## 2020-03-21 DIAGNOSIS — F410 Panic disorder [episodic paroxysmal anxiety] without agoraphobia: Secondary | ICD-10-CM | POA: Diagnosis not present

## 2020-03-28 ENCOUNTER — Ambulatory Visit: Payer: BC Managed Care – PPO | Admitting: Adult Health

## 2020-04-04 ENCOUNTER — Ambulatory Visit (INDEPENDENT_AMBULATORY_CARE_PROVIDER_SITE_OTHER): Payer: BC Managed Care – PPO | Admitting: Psychology

## 2020-04-04 DIAGNOSIS — F410 Panic disorder [episodic paroxysmal anxiety] without agoraphobia: Secondary | ICD-10-CM

## 2020-04-18 ENCOUNTER — Ambulatory Visit (INDEPENDENT_AMBULATORY_CARE_PROVIDER_SITE_OTHER): Payer: BC Managed Care – PPO | Admitting: Psychology

## 2020-04-18 DIAGNOSIS — F410 Panic disorder [episodic paroxysmal anxiety] without agoraphobia: Secondary | ICD-10-CM

## 2020-04-23 ENCOUNTER — Ambulatory Visit: Payer: BC Managed Care – PPO | Admitting: Adult Health

## 2020-05-09 ENCOUNTER — Ambulatory Visit (INDEPENDENT_AMBULATORY_CARE_PROVIDER_SITE_OTHER): Payer: BC Managed Care – PPO | Admitting: Psychology

## 2020-05-09 DIAGNOSIS — F411 Generalized anxiety disorder: Secondary | ICD-10-CM

## 2020-05-20 ENCOUNTER — Other Ambulatory Visit: Payer: Self-pay

## 2020-05-20 ENCOUNTER — Ambulatory Visit (INDEPENDENT_AMBULATORY_CARE_PROVIDER_SITE_OTHER): Payer: BC Managed Care – PPO | Admitting: Adult Health

## 2020-05-20 ENCOUNTER — Encounter: Payer: Self-pay | Admitting: Adult Health

## 2020-05-20 DIAGNOSIS — F331 Major depressive disorder, recurrent, moderate: Secondary | ICD-10-CM

## 2020-05-20 DIAGNOSIS — F411 Generalized anxiety disorder: Secondary | ICD-10-CM | POA: Diagnosis not present

## 2020-05-20 DIAGNOSIS — F422 Mixed obsessional thoughts and acts: Secondary | ICD-10-CM

## 2020-05-20 DIAGNOSIS — F909 Attention-deficit hyperactivity disorder, unspecified type: Secondary | ICD-10-CM | POA: Diagnosis not present

## 2020-05-20 MED ORDER — AMPHETAMINE-DEXTROAMPHET ER 20 MG PO CP24: 20.0000 mg | ORAL_CAPSULE | Freq: Every day | ORAL | 0 refills | Status: DC

## 2020-05-20 MED ORDER — VENLAFAXINE HCL ER 37.5 MG PO CP24: ORAL_CAPSULE | ORAL | 3 refills | Status: DC

## 2020-05-20 NOTE — Progress Notes (Signed)
Natalie GRAFFIUS [id number] Aug 25, 1986 34 y.o.  Subjective:   [name] is a 34 y.o. (DOB 1986-08-01) female.  Chief Complaint: No chief complaint on file.   HPI Natalie [name] presents to the office today for follow-up of MDD, GAD, obsessional acts,  and ADHD.  Describes mood today as "ok". Pleasant. Mood symptoms - denies depression, anxiety, and irritability. Denies any panic attacks. Decreased worry. Decreased picking at fingers. Stating "I'm doing alright". Not feeling as stressed in work setting. Stable interest and motivation. Taking medications as prescribed.  Energy levels stable. Active, does not have a regular exercise routine. Working on getting steps in - 10,000. Enjoys some usual interests and activities. Married. Lives with husband of 11 years and son age 19. Son starting school this month.   Appetite adequate. Weight stable.  Sleeps well most nights. Averages 8 hours.  Focus and concentration improved. Completing tasks. Managing aspects of household. Working part-time at [employer]. Denies SI or HI. Denies AH or VH.  Previous medication trials: Effexor 37.5mg    GAD-7     Office Visit from 05/10/2018 in [facility]  Total GAD-7 Score 21    PHQ2-9     Office Visit from 05/10/2018 in [facility]  PHQ-2 Total Score 6  PHQ-9 Total Score 25       Review of Systems:  Review of Systems  Musculoskeletal: Negative for gait problem.  Neurological: Negative for tremors.  Psychiatric/Behavioral:       Please refer to HPI    Medications: I have reviewed the patient's current medications.  Current Outpatient Medications  Medication Sig Dispense Refill   amphetamine-dextroamphetamine (ADDERALL XR) 20 MG 24 hr capsule Take 1 capsule (20 mg total) by mouth daily. 30 capsule 0   [START ON 06/17/2020] amphetamine-dextroamphetamine (ADDERALL XR) 20 MG 24 hr capsule Take 1 capsule (20 mg total) by mouth daily. 30 capsule 0    [START ON 07/15/2020] amphetamine-dextroamphetamine (ADDERALL XR) 20 MG 24 hr capsule Take 1 capsule (20 mg total) by mouth daily. 30 capsule 0   levonorgestrel (MIRENA) 20 MCG/24HR IUD by Intrauterine route.     venlafaxine XR (EFFEXOR-XR) 37.5 MG 24 hr capsule TAKE 1 CAPSULE BY MOUTH DAILY WITH BREAKFAST for depression and anxiety. 90 capsule 3   No current facility-administered medications for this visit.    Medication Side Effects: None  Allergies: No Known Allergies  No past medical history on file.  Family History  Problem Relation Age of Onset   Cancer Maternal Aunt        breast   Hypertension Paternal Grandmother    Diabetes Paternal Grandfather     Social History   Socioeconomic History   Marital status: Married    Spouse name: Not on file   Number of children: Not on file   Years of education: Not on file   Highest education level: Not on file  Occupational History   Not on file  Tobacco Use   Smoking status: Never Smoker   Smokeless tobacco: Never Used  Substance and Sexual Activity   Alcohol use: Yes    Comment: occassionally   Drug use: No   Sexual activity: Not on file  Other Topics Concern   Not on file  Social History Narrative   Married.   1 child.   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   Social Determinants of Health   Financial  Resource Strain:    Difficulty of Paying Living Expenses:   Food Insecurity:    Worried About Programme researcher, broadcasting/film/video in the Last Year:    Barista in the Last Year:   Transportation Needs:    Freight forwarder (Medical):    Lack of Transportation (Non-Medical):   Physical Activity:    Days of Exercise per Week:    Minutes of Exercise per Session:   Stress:    Feeling of Stress :   Social Connections:    Frequency of Communication with Friends and Family:    Frequency of Social Gatherings with Friends and Family:    Attends  Religious Services:    Active Member of Clubs or Organizations:    Attends Engineer, structural:    Marital Status:   Intimate Partner Violence:    Fear of Current or Ex-Partner:    Emotionally Abused:    Physically Abused:    Sexually Abused:     Past Medical History, Surgical history, Social history, and Family history were reviewed and updated as appropriate.   Please see review of systems for further details on the patient's review from today.   Objective:   Physical Exam:  There were no vitals taken for this visit.  Physical Exam Constitutional:      General: She is not in acute distress. Musculoskeletal:        General: No deformity.  Neurological:     Mental Status: She is alert and oriented to person, place, and time.     Coordination: Coordination normal.  Psychiatric:        Attention and Perception: Attention and perception normal. She does not perceive auditory or visual hallucinations.        Mood and Affect: Mood normal. Mood is not anxious or depressed. Affect is not labile, blunt, angry or inappropriate.        Speech: Speech normal.        Behavior: Behavior normal.        Thought Content: Thought content normal. Thought content is not paranoid or delusional. Thought content does not include homicidal or suicidal ideation. Thought content does not include homicidal or suicidal plan.        Cognition and Memory: Cognition and memory normal.        Judgment: Judgment normal.     Comments: Insight intact     Lab Review:     Component Value Date/Time   NA 140 09/20/2019 1510   K 4.0 09/20/2019 1510   CL 105 09/20/2019 1510   CO2 28 09/20/2019 1510   GLUCOSE 79 09/20/2019 1510   BUN 13 09/20/2019 1510   CREATININE 0.81 09/20/2019 1510   CREATININE 0.73 12/29/2013 1652   CREATININE 0.73 12/29/2013 1652   CALCIUM 9.5 09/20/2019 1510   PROT 7.2 09/20/2019 1510   ALBUMIN 4.2 09/20/2019 1510   AST 28 09/20/2019 1510   ALT 36 (H)  09/20/2019 1510   ALKPHOS 70 09/20/2019 1510   BILITOT 0.4 09/20/2019 1510       Component Value Date/Time   WBC 8.5 10/24/2014 0703   RBC 4.16 10/24/2014 0703   HGB 12.9 10/24/2014 0703   HCT 37.6 10/24/2014 0703   PLT 190 10/24/2014 0703   MCV 90.4 10/24/2014 0703   MCH 31.0 10/24/2014 0703   MCHC 34.3 10/24/2014 0703   RDW 13.1 10/24/2014 0703   LYMPHSABS 1.6 12/05/2012 0812   MONOABS 0.6 12/05/2012 0812   EOSABS 0.2  12/05/2012 0812   BASOSABS 0.0 12/05/2012 0812    No results found for: POCLITH, LITHIUM   No results found for: PHENYTOIN, PHENOBARB, VALPROATE, CBMZ   .res Assessment: Plan:    Plan:  PDMP reviewed  1. Continue Adderall XR 20mg  every morning. 2. Continue Effexor 37.5mg  daily   RTC 4 weeks  Patient advised to contact office with any questions, adverse effects, or acute worsening in signs and symptoms.  Discussed potential benefits, risks, and side effects of stimulants with patient to include increased heart rate, palpitations, insomnia, increased anxiety, increased irritability, or decreased appetite.  Instructed patient to contact office if experiencing any significant tolerability issues.    Diagnoses and all orders for this visit:  Major depressive disorder, recurrent episode, moderate (HCC) -     venlafaxine XR (EFFEXOR-XR) 37.5 MG 24 hr capsule; TAKE 1 CAPSULE BY MOUTH DAILY WITH BREAKFAST for depression and anxiety.  Attention deficit hyperactivity disorder (ADHD), unspecified ADHD type -     amphetamine-dextroamphetamine (ADDERALL XR) 20 MG 24 hr capsule; Take 1 capsule (20 mg total) by mouth daily. -     amphetamine-dextroamphetamine (ADDERALL XR) 20 MG 24 hr capsule; Take 1 capsule (20 mg total) by mouth daily. -     amphetamine-dextroamphetamine (ADDERALL XR) 20 MG 24 hr capsule; Take 1 capsule (20 mg total) by mouth daily.  Generalized anxiety disorder -     venlafaxine XR (EFFEXOR-XR) 37.5 MG 24 hr capsule; TAKE 1 CAPSULE BY MOUTH  DAILY WITH BREAKFAST for depression and anxiety.  Mixed obsessional thoughts and acts -     venlafaxine XR (EFFEXOR-XR) 37.5 MG 24 hr capsule; TAKE 1 CAPSULE BY MOUTH DAILY WITH BREAKFAST for depression and anxiety.     Please see After Visit Summary for patient specific instructions.  No future appointments.  No orders of the defined types were placed in this encounter.   -------------------------------

## 2020-06-13 ENCOUNTER — Ambulatory Visit (INDEPENDENT_AMBULATORY_CARE_PROVIDER_SITE_OTHER): Payer: BC Managed Care – PPO | Admitting: Psychology

## 2020-06-13 DIAGNOSIS — F410 Panic disorder [episodic paroxysmal anxiety] without agoraphobia: Secondary | ICD-10-CM | POA: Diagnosis not present

## 2020-07-02 ENCOUNTER — Ambulatory Visit (INDEPENDENT_AMBULATORY_CARE_PROVIDER_SITE_OTHER): Payer: BC Managed Care – PPO | Admitting: Psychology

## 2020-07-02 DIAGNOSIS — F410 Panic disorder [episodic paroxysmal anxiety] without agoraphobia: Secondary | ICD-10-CM | POA: Diagnosis not present

## 2020-07-04 DIAGNOSIS — Z03818 Encounter for observation for suspected exposure to other biological agents ruled out: Secondary | ICD-10-CM | POA: Diagnosis not present

## 2020-07-04 DIAGNOSIS — Z1152 Encounter for screening for COVID-19: Secondary | ICD-10-CM | POA: Diagnosis not present

## 2020-07-11 ENCOUNTER — Encounter (INDEPENDENT_AMBULATORY_CARE_PROVIDER_SITE_OTHER): Payer: Self-pay

## 2020-07-15 ENCOUNTER — Ambulatory Visit: Payer: BC Managed Care – PPO | Admitting: Psychology

## 2020-08-05 ENCOUNTER — Ambulatory Visit: Payer: BC Managed Care – PPO | Admitting: Psychology

## 2020-08-12 DIAGNOSIS — D1801 Hemangioma of skin and subcutaneous tissue: Secondary | ICD-10-CM | POA: Diagnosis not present

## 2020-08-12 DIAGNOSIS — L988 Other specified disorders of the skin and subcutaneous tissue: Secondary | ICD-10-CM | POA: Diagnosis not present

## 2020-08-12 DIAGNOSIS — D225 Melanocytic nevi of trunk: Secondary | ICD-10-CM | POA: Diagnosis not present

## 2020-08-12 DIAGNOSIS — L814 Other melanin hyperpigmentation: Secondary | ICD-10-CM | POA: Diagnosis not present

## 2020-08-13 ENCOUNTER — Other Ambulatory Visit: Payer: Self-pay

## 2020-08-13 ENCOUNTER — Ambulatory Visit (INDEPENDENT_AMBULATORY_CARE_PROVIDER_SITE_OTHER): Payer: BC Managed Care – PPO | Admitting: Adult Health

## 2020-08-13 ENCOUNTER — Encounter: Payer: Self-pay | Admitting: Adult Health

## 2020-08-13 DIAGNOSIS — F331 Major depressive disorder, recurrent, moderate: Secondary | ICD-10-CM

## 2020-08-13 DIAGNOSIS — F422 Mixed obsessional thoughts and acts: Secondary | ICD-10-CM

## 2020-08-13 DIAGNOSIS — F411 Generalized anxiety disorder: Secondary | ICD-10-CM | POA: Diagnosis not present

## 2020-08-13 DIAGNOSIS — F909 Attention-deficit hyperactivity disorder, unspecified type: Secondary | ICD-10-CM

## 2020-08-13 MED ORDER — AMPHETAMINE-DEXTROAMPHET ER 20 MG PO CP24: 20.0000 mg | ORAL_CAPSULE | Freq: Every day | ORAL | 0 refills | Status: DC

## 2020-08-13 MED ORDER — VENLAFAXINE HCL ER 37.5 MG PO CP24: ORAL_CAPSULE | ORAL | 3 refills | Status: DC

## 2020-08-13 NOTE — Progress Notes (Signed)
Natalie [name] [id number] 05/27/1986 34 y.o.  Subjective:   [name] is a 34 y.o. (DOB 1985-12-17) female.  Chief Complaint: No chief complaint on file.   HPI CHERESA SIERS presents to the office today for follow-up of MDD, GAD, obsessional acts,  and ADHD.  Describes mood today as "ok". Pleasant. Mood symptoms - denies depression. Feels more anxious than normal. More irritable overall. Feels like she has been "all over the place". Getting irritated over "little things that shouldn't matter". Trying to be more "patient". Reports one panic attack after argument with mother. She and husband arguing more. Financial stressors. Stable interest and motivation. Taking medications as prescribed.  Energy levels stable. Active, does not have a regular exercise routine.  Enjoys some usual interests and activities. Married. Lives with husband of 11 years and son age 32.  Appetite adequate. Weight stable - 170 pounds. Sleeps well most nights. Averages 8 hours.  Focus and concentration improved. Completing tasks. Managing aspects of household. Working part-time at [employer]. Denies SI or HI. Denies AH or VH.  Previous medication trials: Effexor 37.5mg     GAD-7     Office Visit from 05/10/2018 in [facility]  Total GAD-7 Score 21    PHQ2-9     Office Visit from 05/10/2018 in [facility]  PHQ-2 Total Score 6  PHQ-9 Total Score 25       Review of Systems:  Review of Systems  Musculoskeletal: Negative for gait problem.  Neurological: Negative for tremors.  Psychiatric/Behavioral:       Please refer to HPI    Medications: I have reviewed the patient's current medications.  Current Outpatient Medications  Medication Sig Dispense Refill   amphetamine-dextroamphetamine (ADDERALL XR) 20 MG 24 hr capsule Take 1 capsule (20 mg total) by mouth daily. 30 capsule 0   [START ON 09/10/2020] amphetamine-dextroamphetamine (ADDERALL XR) 20 MG 24  hr capsule Take 1 capsule (20 mg total) by mouth daily. 30 capsule 0   [START ON 10/08/2020] amphetamine-dextroamphetamine (ADDERALL XR) 20 MG 24 hr capsule Take 1 capsule (20 mg total) by mouth daily. 30 capsule 0   levonorgestrel (MIRENA) 20 MCG/24HR IUD by Intrauterine route.     venlafaxine XR (EFFEXOR-XR) 37.5 MG 24 hr capsule TAKE 1 CAPSULE BY MOUTH DAILY WITH BREAKFAST for depression and anxiety. 90 capsule 3   No current facility-administered medications for this visit.    Medication Side Effects: None  Allergies: No Known Allergies  No past medical history on file.  Family History  Problem Relation Age of Onset   Cancer Maternal Aunt        breast   Hypertension Paternal Grandmother    Diabetes Paternal Grandfather     Social History   Socioeconomic History   Marital status: Married    Spouse name: Not on file   Number of children: Not on file   Years of education: Not on file   Highest education level: Not on file  Occupational History   Not on file  Tobacco Use   Smoking status: Never Smoker   Smokeless tobacco: Never Used  Substance and Sexual Activity   Alcohol use: Yes    Comment: occassionally   Drug use: No   Sexual activity: Not on file  Other Topics Concern   Not on file  Social History Narrative   Married.   1 child.   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   Enjoys spending  time with family.   Social Determinants of Health   Financial Resource Strain:    Difficulty of Paying Living Expenses: Not on file  Food Insecurity:    Worried About Programme researcher, broadcasting/film/video in the Last Year: Not on file   [employer] of Food in the Last Year: Not on file  Transportation Needs:    Lack of Transportation (Medical): Not on file   Lack of Transportation (Non-Medical): Not on file  Physical Activity:    Days of Exercise per Week: Not on file   Minutes of Exercise per Session: Not on file  Stress:    Feeling of Stress :  Not on file  Social Connections:    Frequency of Communication with Friends and Family: Not on file   Frequency of Social Gatherings with Friends and Family: Not on file   Attends Religious Services: Not on file   Active Member of Clubs or Organizations: Not on file   Attends Banker Meetings: Not on file   Marital Status: Not on file  Intimate Partner Violence:    Fear of Current or Ex-Partner: Not on file   Emotionally Abused: Not on file   Physically Abused: Not on file   Sexually Abused: Not on file    Past Medical History, Surgical history, Social history, and Family history were reviewed and updated as appropriate.   Please see review of systems for further details on the patient's review from today.   Objective:   Physical Exam:  There were no vitals taken for this visit.  Physical Exam Constitutional:      General: She is not in acute distress. Musculoskeletal:        General: No deformity.  Neurological:     Mental Status: She is alert and oriented to person, place, and time.     Coordination: Coordination normal.  Psychiatric:        Attention and Perception: Attention and perception normal. She does not perceive auditory or visual hallucinations.        Mood and Affect: Mood normal. Mood is not anxious or depressed. Affect is not labile, blunt, angry or inappropriate.        Speech: Speech normal.        Behavior: Behavior normal.        Thought Content: Thought content normal. Thought content is not paranoid or delusional. Thought content does not include homicidal or suicidal ideation. Thought content does not include homicidal or suicidal plan.        Cognition and Memory: Cognition and memory normal.        Judgment: Judgment normal.     Comments: Insight intact     Lab Review:     Component Value Date/Time   NA 140 09/20/2019 1510   K 4.0 09/20/2019 1510   CL 105 09/20/2019 1510   CO2 28 09/20/2019 1510   GLUCOSE 79 09/20/2019  1510   BUN 13 09/20/2019 1510   CREATININE 0.81 09/20/2019 1510   CREATININE 0.73 12/29/2013 1652   CREATININE 0.73 12/29/2013 1652   CALCIUM 9.5 09/20/2019 1510   PROT 7.2 09/20/2019 1510   ALBUMIN 4.2 09/20/2019 1510   AST 28 09/20/2019 1510   ALT 36 (H) 09/20/2019 1510   ALKPHOS 70 09/20/2019 1510   BILITOT 0.4 09/20/2019 1510       Component Value Date/Time   WBC 8.5 10/24/2014 0703   RBC 4.16 10/24/2014 0703   HGB 12.9 10/24/2014 0703   HCT 37.6  10/24/2014 0703   PLT 190 10/24/2014 0703   MCV 90.4 10/24/2014 0703   MCH 31.0 10/24/2014 0703   MCHC 34.3 10/24/2014 0703   RDW 13.1 10/24/2014 0703   LYMPHSABS 1.6 12/05/2012 0812   MONOABS 0.6 12/05/2012 0812   EOSABS 0.2 12/05/2012 0812   BASOSABS 0.0 12/05/2012 0812    No results found for: POCLITH, LITHIUM   No results found for: PHENYTOIN, PHENOBARB, VALPROATE, CBMZ   .res Assessment: Plan:    Plan:  PDMP reviewed  1. Continue Adderall XR 20mg  every morning. 2. Continue Effexor 37.5mg  daily   136/51/60  RTC 3 months  Patient advised to contact office with any questions, adverse effects, or acute worsening in signs and symptoms.  Discussed potential benefits, risks, and side effects of stimulants with patient to include increased heart rate, palpitations, insomnia, increased anxiety, increased irritability, or decreased appetite.  Instructed patient to contact office if experiencing any significant tolerability issues.    Diagnoses and all orders for this visit:  Attention deficit hyperactivity disorder (ADHD), unspecified ADHD type -     amphetamine-dextroamphetamine (ADDERALL XR) 20 MG 24 hr capsule; Take 1 capsule (20 mg total) by mouth daily. -     amphetamine-dextroamphetamine (ADDERALL XR) 20 MG 24 hr capsule; Take 1 capsule (20 mg total) by mouth daily. -     amphetamine-dextroamphetamine (ADDERALL XR) 20 MG 24 hr capsule; Take 1 capsule (20 mg total) by mouth daily.  Major depressive disorder,  recurrent episode, moderate (HCC) -     venlafaxine XR (EFFEXOR-XR) 37.5 MG 24 hr capsule; TAKE 1 CAPSULE BY MOUTH DAILY WITH BREAKFAST for depression and anxiety.  Generalized anxiety disorder -     venlafaxine XR (EFFEXOR-XR) 37.5 MG 24 hr capsule; TAKE 1 CAPSULE BY MOUTH DAILY WITH BREAKFAST for depression and anxiety.  Mixed obsessional thoughts and acts -     venlafaxine XR (EFFEXOR-XR) 37.5 MG 24 hr capsule; TAKE 1 CAPSULE BY MOUTH DAILY WITH BREAKFAST for depression and anxiety.     Please see After Visit Summary for patient specific instructions.  Future Appointments  Date Time Provider Department Center  08/20/2020  8:00 AM 13/06/2020, [facility] None  08/30/2020  8:00 AM 09/01/2020, [facility] None  09/03/2020  8:00 AM 09/05/2020, [facility] None  09/17/2020  8:00 AM 14/04/2020, [facility] None  09/25/2020  3:00 PM 09/27/2020 [facility] None  10/09/2020  8:00 AM 10/11/2020, [facility] None    No orders of the defined types were placed in this encounter.   -------------------------------

## 2020-08-20 ENCOUNTER — Ambulatory Visit: Payer: BC Managed Care – PPO | Admitting: Adult Health

## 2020-08-20 ENCOUNTER — Ambulatory Visit (INDEPENDENT_AMBULATORY_CARE_PROVIDER_SITE_OTHER): Payer: BC Managed Care – PPO | Admitting: Psychology

## 2020-08-20 DIAGNOSIS — F410 Panic disorder [episodic paroxysmal anxiety] without agoraphobia: Secondary | ICD-10-CM

## 2020-08-21 ENCOUNTER — Telehealth: Payer: Self-pay | Admitting: Adult Health

## 2020-08-21 NOTE — Telephone Encounter (Signed)
Pt would like a refill for Adderall. Please send to [facility] on [address]

## 2020-08-22 NOTE — Telephone Encounter (Signed)
Patient should already have Rx's on file to fill through December

## 2020-08-30 ENCOUNTER — Ambulatory Visit: Payer: BC Managed Care – PPO | Admitting: Psychology

## 2020-09-03 ENCOUNTER — Ambulatory Visit (INDEPENDENT_AMBULATORY_CARE_PROVIDER_SITE_OTHER): Payer: BC Managed Care – PPO | Admitting: Psychology

## 2020-09-03 DIAGNOSIS — F410 Panic disorder [episodic paroxysmal anxiety] without agoraphobia: Secondary | ICD-10-CM

## 2020-09-17 ENCOUNTER — Ambulatory Visit: Payer: BC Managed Care – PPO | Admitting: Psychology

## 2020-09-25 ENCOUNTER — Ambulatory Visit (INDEPENDENT_AMBULATORY_CARE_PROVIDER_SITE_OTHER): Payer: BC Managed Care – PPO | Admitting: Psychology

## 2020-09-25 DIAGNOSIS — F410 Panic disorder [episodic paroxysmal anxiety] without agoraphobia: Secondary | ICD-10-CM

## 2020-09-30 ENCOUNTER — Ambulatory Visit: Payer: PRIVATE HEALTH INSURANCE | Admitting: Primary Care

## 2020-09-30 ENCOUNTER — Encounter: Payer: Self-pay | Admitting: Primary Care

## 2020-09-30 ENCOUNTER — Ambulatory Visit (INDEPENDENT_AMBULATORY_CARE_PROVIDER_SITE_OTHER): Payer: BC Managed Care – PPO | Admitting: Primary Care

## 2020-09-30 ENCOUNTER — Other Ambulatory Visit: Payer: Self-pay

## 2020-09-30 VITALS — BP 124/72 | HR 100 | Temp 98.6°F | Ht 64.0 in | Wt 172.0 lb

## 2020-09-30 DIAGNOSIS — E785 Hyperlipidemia, unspecified: Secondary | ICD-10-CM | POA: Diagnosis not present

## 2020-09-30 DIAGNOSIS — F988 Other specified behavioral and emotional disorders with onset usually occurring in childhood and adolescence: Secondary | ICD-10-CM | POA: Diagnosis not present

## 2020-09-30 DIAGNOSIS — F419 Anxiety disorder, unspecified: Secondary | ICD-10-CM

## 2020-09-30 DIAGNOSIS — Z0000 Encounter for general adult medical examination without abnormal findings: Secondary | ICD-10-CM

## 2020-09-30 DIAGNOSIS — F32A Depression, unspecified: Secondary | ICD-10-CM

## 2020-09-30 DIAGNOSIS — Z111 Encounter for screening for respiratory tuberculosis: Secondary | ICD-10-CM | POA: Diagnosis not present

## 2020-09-30 DIAGNOSIS — Z23 Encounter for immunization: Secondary | ICD-10-CM | POA: Diagnosis not present

## 2020-09-30 DIAGNOSIS — M545 Low back pain, unspecified: Secondary | ICD-10-CM

## 2020-09-30 DIAGNOSIS — G8929 Other chronic pain: Secondary | ICD-10-CM

## 2020-09-30 NOTE — Addendum Note (Signed)
Addended by: Donnamarie Poag on: 09/30/2020 03:37 PM   Modules accepted: Orders

## 2020-09-30 NOTE — Progress Notes (Signed)
Subjective:    Patient ID: Natalie [name], female    DOB: Mar 09, 1986, 34 y.o.   MRN: [id number]  HPI  This visit occurred during the SARS-CoV-2 public health emergency.  Safety protocols were in place, including screening questions prior to the visit, additional usage of staff PPE, and extensive cleaning of exam room while observing appropriate contact time as indicated for disinfecting solutions.   Natalie [name] is a 34 year old female who presents today for complete physical. She is also needing a form completed for work.   Immunizations: -Tetanus: Completed in 2015 -Influenza: Due -Covid-19: Completed series  Diet: She endorses a fair diet. Exercise: She is not exercising.  Eye exam: Completes annually  Dental exam: Completes semi-annually   Pap Smear: Follows with GYN. [facility].   BP Readings from Last 3 Encounters:  09/30/20 124/72  09/20/19 118/82  05/10/18 114/82     Review of Systems  Constitutional: Negative for unexpected weight change.  HENT: Negative for rhinorrhea.   Respiratory: Negative for cough and shortness of breath.   Cardiovascular: Negative for chest pain.  Gastrointestinal: Negative for constipation and diarrhea.  Genitourinary: Negative for difficulty urinating and menstrual problem.  Musculoskeletal: Positive for arthralgias and back pain.  Skin: Negative for rash.  Allergic/Immunologic: Negative for environmental allergies.  Neurological: Negative for dizziness, numbness and headaches.  Psychiatric/Behavioral: The patient is not nervous/anxious.        History reviewed. No pertinent past medical history.   Social History   Socioeconomic History  . Marital status: Married    Spouse name: Not on file  . Number of children: Not on file  . Years of education: Not on file  . Highest education level: Not on file  Occupational History  . Not on file  Tobacco Use  . Smoking status: Never Smoker  . Smokeless tobacco: Never Used   Substance and Sexual Activity  . Alcohol use: Yes    Comment: occassionally  . Drug use: No  . Sexual activity: Not on file  Other Topics Concern  . Not on file  Social History Narrative   Married.   1 child.   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   Social Determinants of Health   Financial Resource Strain: Not on file  Food Insecurity: Not on file  Transportation Needs: Not on file  Physical Activity: Not on file  Stress: Not on file  Social Connections: Not on file  Intimate Partner Violence: Not on file    Past Surgical History:  Procedure Laterality Date  . BACK SURGERY    . DILATION AND EVACUATION N/A 04/30/2014   Procedure: DILATATION AND EVACUATION;  Surgeon: Philip Aspen, DO;  Location: [facility];  Service: Gynecology;  Laterality: N/A;  . DILATION AND EVACUATION N/A 10/24/2014   Procedure: DILATATION AND EVACUATION with genetic studies;  Surgeon: Mitchel Honour, DO;  Location: [facility];  Service: Gynecology;  Laterality: N/A;  genetic studies    Family History  Problem Relation Age of Onset  . Cancer Maternal Aunt        breast  . Hypertension Paternal Grandmother   . Diabetes Paternal Grandfather     No Known Allergies  Current Outpatient Medications on File Prior to Visit  Medication Sig Dispense Refill  . amphetamine-dextroamphetamine (ADDERALL XR) 20 MG 24 hr capsule Take 1 capsule (20 mg total) by mouth daily. 30 capsule 0  . amphetamine-dextroamphetamine (ADDERALL XR) 20  MG 24 hr capsule Take 1 capsule (20 mg total) by mouth daily. 30 capsule 0  . [START ON 10/08/2020] amphetamine-dextroamphetamine (ADDERALL XR) 20 MG 24 hr capsule Take 1 capsule (20 mg total) by mouth daily. 30 capsule 0  . levonorgestrel (MIRENA) 20 MCG/24HR IUD by Intrauterine route.    . venlafaxine XR (EFFEXOR-XR) 37.5 MG 24 hr capsule TAKE 1 CAPSULE BY MOUTH DAILY WITH BREAKFAST for depression and anxiety. 90 capsule 3   No  current facility-administered medications on file prior to visit.    BP 124/72   Pulse 100   Temp 98.6 F (37 C) (Temporal)   Ht 5\' 4"  (1.626 m)   Wt 172 lb (78 kg)   SpO2 97%   BMI 29.52 kg/m    Objective:   Physical Exam Constitutional:      Appearance: She is well-nourished.  HENT:     Right Ear: Tympanic membrane and ear canal normal.     Left Ear: Tympanic membrane and ear canal normal.     Mouth/Throat:     Mouth: Oropharynx is clear and moist.  Eyes:     Extraocular Movements: EOM normal.     Pupils: Pupils are equal, round, and reactive to light.  Cardiovascular:     Rate and Rhythm: Normal rate and regular rhythm.  Pulmonary:     Effort: Pulmonary effort is normal.     Breath sounds: Normal breath sounds.  Abdominal:     General: Bowel sounds are normal.     Palpations: Abdomen is soft.     Tenderness: There is no abdominal tenderness.  Musculoskeletal:        General: Normal range of motion.     Cervical back: Neck supple.  Skin:    General: Skin is warm and dry.  Neurological:     Mental Status: She is alert and oriented to person, place, and time.     Cranial Nerves: No cranial nerve deficit.     Deep Tendon Reflexes:     Reflex Scores:      Patellar reflexes are 2+ on the right side and 2+ on the left side. Psychiatric:        Mood and Affect: Mood and affect and mood normal.            Assessment & Plan:

## 2020-09-30 NOTE — Assessment & Plan Note (Signed)
Doing well on Adderall XR 20 mg, following with psychiatry. Continue same.

## 2020-09-30 NOTE — Assessment & Plan Note (Signed)
Well controlled on venlafaxine XR 37.5. Following with psychiatry.

## 2020-09-30 NOTE — Assessment & Plan Note (Signed)
Discussed the importance of a healthy diet and regular exercise in order for weight loss, and to reduce the risk of any potential medical problems.  Repeat lipid panel pending. 

## 2020-09-30 NOTE — Assessment & Plan Note (Addendum)
Chronic, intermittent, increased recently.  She is working on stretching, walking.   Continue to monitor.

## 2020-09-30 NOTE — Patient Instructions (Addendum)
Stop by the lab prior to leaving today. I will notify you of your results once received.   Start exercising. You should be getting 150 minutes of moderate intensity exercise weekly.  Ensure you are consuming 64 ounces of water daily.  It was a pleasure to see you today!   Preventive Care 20-36 Years Old, Female Preventive care refers to visits with your health care provider and lifestyle choices that can promote health and wellness. This includes:  A yearly physical exam. This may also be called an annual well check.  Regular dental visits and eye exams.  Immunizations.  Screening for certain conditions.  Healthy lifestyle choices, such as eating a healthy diet, getting regular exercise, not using drugs or products that contain nicotine and tobacco, and limiting alcohol use. What can I expect for my preventive care visit? Physical exam Your health care provider will check your:  Height and weight. This may be used to calculate body mass index (BMI), which tells if you are at a healthy weight.  Heart rate and blood pressure.  Skin for abnormal spots. Counseling Your health care provider may ask you questions about your:  Alcohol, tobacco, and drug use.  Emotional well-being.  Home and relationship well-being.  Sexual activity.  Eating habits.  Work and work Statistician.  Method of birth control.  Menstrual cycle.  Pregnancy history. What immunizations do I need?  Influenza (flu) vaccine  This is recommended every year. Tetanus, diphtheria, and pertussis (Tdap) vaccine  You may need a Td booster every 10 years. Varicella (chickenpox) vaccine  You may need this if you have not been vaccinated. Human papillomavirus (HPV) vaccine  If recommended by your health care provider, you may need three doses over 6 months. Measles, mumps, and rubella (MMR) vaccine  You may need at least one dose of MMR. You may also need a second dose. Meningococcal conjugate  (MenACWY) vaccine  One dose is recommended if you are age 43-21 years and a first-year college student living in a residence hall, or if you have one of several medical conditions. You may also need additional booster doses. Pneumococcal conjugate (PCV13) vaccine  You may need this if you have certain conditions and were not previously vaccinated. Pneumococcal polysaccharide (PPSV23) vaccine  You may need one or two doses if you smoke cigarettes or if you have certain conditions. Hepatitis A vaccine  You may need this if you have certain conditions or if you travel or work in places where you may be exposed to hepatitis A. Hepatitis B vaccine  You may need this if you have certain conditions or if you travel or work in places where you may be exposed to hepatitis B. Haemophilus influenzae type b (Hib) vaccine  You may need this if you have certain conditions. You may receive vaccines as individual doses or as more than one vaccine together in one shot (combination vaccines). Talk with your health care provider about the risks and benefits of combination vaccines. What tests do I need?  Blood tests  Lipid and cholesterol levels. These may be checked every 5 years starting at age 55.  Hepatitis C test.  Hepatitis B test. Screening  Diabetes screening. This is done by checking your blood sugar (glucose) after you have not eaten for a while (fasting).  Sexually transmitted disease (STD) testing.  BRCA-related cancer screening. This may be done if you have a family history of breast, ovarian, tubal, or peritoneal cancers.  Pelvic exam and Pap test. This  may be done every 3 years starting at age 38. Starting at age 88, this may be done every 5 years if you have a Pap test in combination with an HPV test. Talk with your health care provider about your test results, treatment options, and if necessary, the need for more tests. Follow these instructions at home: Eating and  drinking   Eat a diet that includes fresh fruits and vegetables, whole grains, lean protein, and low-fat dairy.  Take vitamin and mineral supplements as recommended by your health care provider.  Do not drink alcohol if: ? Your health care provider tells you not to drink. ? You are pregnant, may be pregnant, or are planning to become pregnant.  If you drink alcohol: ? Limit how much you have to 0-1 drink a day. ? Be aware of how much alcohol is in your drink. In the [place] one drink equals one 12 oz bottle of beer (355 mL), one 5 oz glass of wine (148 mL), or one 1 oz glass of hard liquor (44 mL). Lifestyle  Take daily care of your teeth and gums.  Stay active. Exercise for at least 30 minutes on 5 or more days each week.  Do not use any products that contain nicotine or tobacco, such as cigarettes, e-cigarettes, and chewing tobacco. If you need help quitting, ask your health care provider.  If you are sexually active, practice safe sex. Use a condom or other form of birth control (contraception) in order to prevent pregnancy and STIs (sexually transmitted infections). If you plan to become pregnant, see your health care provider for a preconception visit. What's next?  Visit your health care provider once a year for a well check visit.  Ask your health care provider how often you should have your eyes and teeth checked.  Stay up to date on all vaccines. This information is not intended to replace advice given to you by your health care provider. Make sure you discuss any questions you have with your health care provider. Document Revised: 06/09/2018 Document Reviewed: 06/09/2018 Elsevier Patient Education  2020 [employer].

## 2020-09-30 NOTE — Assessment & Plan Note (Addendum)
Adult immunizations UTD. Pap smear UTD. Discussed the importance of a healthy diet and regular exercise in order for weight loss, and to reduce the risk of any potential medical problems.  Exam today unremarkable. Labs pending.

## 2020-10-01 LAB — CBC
HCT: 39 % (ref 36.0–46.0)
Hemoglobin: 13.2 g/dL (ref 12.0–15.0)
MCHC: 33.7 g/dL (ref 30.0–36.0)
MCV: 91.9 fl (ref 78.0–100.0)
Platelets: 217 10*3/uL (ref 150.0–400.0)
RBC: 4.25 Mil/uL (ref 3.87–5.11)
RDW: 12.5 % (ref 11.5–15.5)
WBC: 6 10*3/uL (ref 4.0–10.5)

## 2020-10-01 LAB — LIPID PANEL
Cholesterol: 212 mg/dL — ABNORMAL HIGH (ref 0–200)
HDL: 60.3 mg/dL (ref >39.00)
LDL Cholesterol: 138 mg/dL — ABNORMAL HIGH (ref 0–99)
NonHDL: 151.43
Total CHOL/HDL Ratio: 4
Triglycerides: 66 mg/dL (ref 0.0–149.0)
VLDL: 13.2 mg/dL (ref 0.0–40.0)

## 2020-10-01 LAB — COMPREHENSIVE METABOLIC PANEL
ALT: 23 U/L (ref 0–35)
AST: 23 U/L (ref 0–37)
Albumin: 4.3 g/dL (ref 3.5–5.2)
Alkaline Phosphatase: 61 U/L (ref 39–117)
BUN: 20 mg/dL (ref 6–23)
CO2: 28 mEq/L (ref 19–32)
Calcium: 9.5 mg/dL (ref 8.4–10.5)
Chloride: 105 mEq/L (ref 96–112)
Creatinine, Ser: 0.87 mg/dL (ref 0.40–1.20)
GFR: 85.57 mL/min (ref >60.00)
Glucose, Bld: 82 mg/dL (ref 70–99)
Potassium: 4.1 mEq/L (ref 3.5–5.1)
Sodium: 138 mEq/L (ref 135–145)
Total Bilirubin: 0.3 mg/dL (ref 0.2–1.2)
Total Protein: 7.1 g/dL (ref 6.0–8.3)

## 2020-10-09 ENCOUNTER — Ambulatory Visit (INDEPENDENT_AMBULATORY_CARE_PROVIDER_SITE_OTHER): Payer: BC Managed Care – PPO | Admitting: Psychology

## 2020-10-09 DIAGNOSIS — F410 Panic disorder [episodic paroxysmal anxiety] without agoraphobia: Secondary | ICD-10-CM | POA: Diagnosis not present

## 2020-10-23 ENCOUNTER — Ambulatory Visit (INDEPENDENT_AMBULATORY_CARE_PROVIDER_SITE_OTHER): Payer: BC Managed Care – PPO | Admitting: Psychology

## 2020-10-23 DIAGNOSIS — F410 Panic disorder [episodic paroxysmal anxiety] without agoraphobia: Secondary | ICD-10-CM | POA: Diagnosis not present

## 2020-10-29 DIAGNOSIS — Z03818 Encounter for observation for suspected exposure to other biological agents ruled out: Secondary | ICD-10-CM | POA: Diagnosis not present

## 2020-10-29 DIAGNOSIS — Z20822 Contact with and (suspected) exposure to covid-19: Secondary | ICD-10-CM | POA: Diagnosis not present

## 2020-10-30 ENCOUNTER — Ambulatory Visit (INDEPENDENT_AMBULATORY_CARE_PROVIDER_SITE_OTHER): Payer: BC Managed Care – PPO | Admitting: Psychology

## 2020-10-30 DIAGNOSIS — F410 Panic disorder [episodic paroxysmal anxiety] without agoraphobia: Secondary | ICD-10-CM | POA: Diagnosis not present

## 2020-11-07 ENCOUNTER — Encounter: Payer: Self-pay | Admitting: Primary Care

## 2020-11-13 ENCOUNTER — Ambulatory Visit: Payer: BC Managed Care – PPO | Admitting: Adult Health

## 2020-11-13 ENCOUNTER — Ambulatory Visit: Payer: BC Managed Care – PPO | Admitting: Psychology

## 2020-11-25 ENCOUNTER — Other Ambulatory Visit: Payer: Self-pay | Admitting: Adult Health

## 2020-11-25 ENCOUNTER — Telehealth: Payer: Self-pay | Admitting: Adult Health

## 2020-11-25 DIAGNOSIS — F909 Attention-deficit hyperactivity disorder, unspecified type: Secondary | ICD-10-CM

## 2020-11-25 MED ORDER — AMPHETAMINE-DEXTROAMPHET ER 20 MG PO CP24: 20.0000 mg | ORAL_CAPSULE | Freq: Every day | ORAL | 0 refills | Status: DC

## 2020-11-25 NOTE — Telephone Encounter (Signed)
Script sent  

## 2020-11-25 NOTE — Telephone Encounter (Signed)
Pt called and left a message stating that she needed a refill on her adderall 20 mg to be sent to the [facility] in [place]

## 2020-12-05 ENCOUNTER — Telehealth (INDEPENDENT_AMBULATORY_CARE_PROVIDER_SITE_OTHER): Payer: BC Managed Care – PPO | Admitting: Adult Health

## 2020-12-05 ENCOUNTER — Encounter: Payer: Self-pay | Admitting: Adult Health

## 2020-12-05 DIAGNOSIS — F331 Major depressive disorder, recurrent, moderate: Secondary | ICD-10-CM

## 2020-12-05 DIAGNOSIS — F909 Attention-deficit hyperactivity disorder, unspecified type: Secondary | ICD-10-CM | POA: Diagnosis not present

## 2020-12-05 DIAGNOSIS — F422 Mixed obsessional thoughts and acts: Secondary | ICD-10-CM

## 2020-12-05 DIAGNOSIS — F411 Generalized anxiety disorder: Secondary | ICD-10-CM | POA: Diagnosis not present

## 2020-12-05 MED ORDER — AMPHETAMINE-DEXTROAMPHET ER 20 MG PO CP24: 20.0000 mg | ORAL_CAPSULE | Freq: Every day | ORAL | 0 refills | Status: DC

## 2020-12-05 MED ORDER — VENLAFAXINE HCL ER 37.5 MG PO CP24: ORAL_CAPSULE | ORAL | 3 refills | Status: DC

## 2020-12-05 NOTE — Progress Notes (Deleted)
Natalie [name] [id number] October 14, 1985 35 y.o.  Subjective:   [name] is a 35 y.o. (DOB September 26, 1986) female.  Chief Complaint: No chief complaint on file.   HPI SOPHIAGRACE BENBROOK presents to the office today for follow-up of MDD, GAD, obsessional acts,  and ADHD.  Describes mood today as "ok". Pleasant. Mood symptoms - decreased depression, irritability, and anxiety Denies panic attacks. Stating "everything has just fell into place". She and mother have talked and worked things out. She and husband doing well - "things are a lot better". Decreased fifnancial stressors. Stable interest and motivation. Taking medications as prescribed.  Energy levels stable. Active, does not have a regular exercise routine.  Enjoys some usual interests and activities. Married. Lives with husband of 11 years and son age 30.  Appetite adequate. Weight loss 8 pounds - 162 from 170 pounds. Sleeps well most nights. Averages 8 hours.  Focus and concentration improved. Completing tasks. Managing aspects of household. Working full time as a Office manager. Denies SI or HI.  Denies AH or VH.  Previous medication trials: Effexor 37.5mg     GAD-7   Flowsheet Row Office Visit from 05/10/2018 in [facility]  Total GAD-7 Score 21    PHQ2-9   Flowsheet Row Office Visit from 09/30/2020 in [facility] Visit from 05/10/2018 in [facility]  PHQ-2 Total Score 0 6  PHQ-9 Total Score 0 25       Review of Systems:  Review of Systems  Musculoskeletal: Negative for gait problem.  Neurological: Negative for tremors.  Psychiatric/Behavioral:       Please refer to HPI    Medications: I have reviewed the patient's current medications.  Current Outpatient Medications  Medication Sig Dispense Refill  . amphetamine-dextroamphetamine (ADDERALL XR) 20 MG 24 hr capsule Take 1 capsule (20 mg total) by mouth daily. 30 capsule 0  .  amphetamine-dextroamphetamine (ADDERALL XR) 20 MG 24 hr capsule Take 1 capsule (20 mg total) by mouth daily. 30 capsule 0  . amphetamine-dextroamphetamine (ADDERALL XR) 20 MG 24 hr capsule Take 1 capsule (20 mg total) by mouth daily. 30 capsule 0  . levonorgestrel (MIRENA) 20 MCG/24HR IUD by Intrauterine route.    . venlafaxine XR (EFFEXOR-XR) 37.5 MG 24 hr capsule TAKE 1 CAPSULE BY MOUTH DAILY WITH BREAKFAST for depression and anxiety. 90 capsule 3   No current facility-administered medications for this visit.    Medication Side Effects: None  Allergies: No Known Allergies  No past medical history on file.  Family History  Problem Relation Age of Onset  . Cancer Maternal Aunt        breast  . Hypertension Paternal Grandmother   . Diabetes Paternal Grandfather     Social History   Socioeconomic History  . Marital status: Married    Spouse name: Not on file  . Number of children: Not on file  . Years of education: Not on file  . Highest education level: Not on file  Occupational History  . Not on file  Tobacco Use  . Smoking status: Never Smoker  . Smokeless tobacco: Never Used  Substance and Sexual Activity  . Alcohol use: Yes    Comment: occassionally  . Drug use: No  . Sexual activity: Not on file  Other Topics Concern  . Not on file  Social History Narrative   Married.   1 child.   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   Social Determinants of Health   Financial Resource Strain: Not on file  Food Insecurity: Not on file  Transportation Needs: Not on file  Physical Activity: Not on file  Stress: Not on file  Social Connections: Not on file  Intimate Partner Violence: Not on file    Past Medical History, Surgical history, Social history, and Family history were reviewed and updated as appropriate.   Please see review of systems for further details on the patient's review from today.   Objective:    Physical Exam:  There were no vitals taken for this visit.  Physical Exam Neurological:     Mental Status: She is alert and oriented to person, place, and time.     Cranial Nerves: No dysarthria.  Psychiatric:        Attention and Perception: Attention and perception normal.        Mood and Affect: Mood normal.        Speech: Speech normal.        Behavior: Behavior is cooperative.        Thought Content: Thought content normal. Thought content is not paranoid or delusional. Thought content does not include homicidal or suicidal ideation. Thought content does not include homicidal or suicidal plan.        Cognition and Memory: Cognition and memory normal.        Judgment: Judgment normal.     Comments: Insight intact     Lab Review:     Component Value Date/Time   NA 138 09/30/2020 1542   K 4.1 09/30/2020 1542   CL 105 09/30/2020 1542   CO2 28 09/30/2020 1542   GLUCOSE 82 09/30/2020 1542   BUN 20 09/30/2020 1542   CREATININE 0.87 09/30/2020 1542   CREATININE 0.73 12/29/2013 1652   CREATININE 0.73 12/29/2013 1652   CALCIUM 9.5 09/30/2020 1542   PROT 7.1 09/30/2020 1542   ALBUMIN 4.3 09/30/2020 1542   AST 23 09/30/2020 1542   ALT 23 09/30/2020 1542   ALKPHOS 61 09/30/2020 1542   BILITOT 0.3 09/30/2020 1542       Component Value Date/Time   WBC 6.0 09/30/2020 1542   RBC 4.25 09/30/2020 1542   HGB 13.2 09/30/2020 1542   HCT 39.0 09/30/2020 1542   PLT 217.0 09/30/2020 1542   MCV 91.9 09/30/2020 1542   MCH 31.0 10/24/2014 0703   MCHC 33.7 09/30/2020 1542   RDW 12.5 09/30/2020 1542   LYMPHSABS 1.6 12/05/2012 0812   MONOABS 0.6 12/05/2012 0812   EOSABS 0.2 12/05/2012 0812   BASOSABS 0.0 12/05/2012 0812    No results found for: POCLITH, LITHIUM   No results found for: PHENYTOIN, PHENOBARB, VALPROATE, CBMZ   .res Assessment: Plan:    Plan:  PDMP reviewed  1. Continue Adderall XR 20mg  every morning. 2. Continue Effexor 37.5mg  daily   BP - teleheath -  normal in December per pt.   RTC 3 months  Patient advised to contact office with any questions, adverse effects, or acute worsening in signs and symptoms.  Discussed potential benefits, risks, and side effects of stimulants with patient to include increased heart rate, palpitations, insomnia, increased anxiety, increased irritability, or decreased appetite.  Instructed patient to contact office if experiencing any significant tolerability issues.   Diagnoses and all orders for this visit:  Attention deficit hyperactivity disorder (ADHD), unspecified ADHD type  Major depressive disorder, recurrent episode, moderate (HCC)  Generalized anxiety disorder  Mixed obsessional thoughts and  acts     Please see After Visit Summary for patient specific instructions.  No future appointments.  No orders of the defined types were placed in this encounter.   -------------------------------

## 2020-12-09 NOTE — Addendum Note (Signed)
Addended by: Dorothyann Gibbs on: 12/09/2020 07:47 AM   Modules accepted: Level of Service

## 2020-12-09 NOTE — Progress Notes (Signed)
Natalie [name] [id number] 1985-11-24 35 y.o.  Virtual Visit via Telephone Note  I connected with pt on 12/09/20 at  3:40 PM EST by telephone and verified that I am speaking with the correct person using two identifiers.   I discussed the limitations, risks, security and privacy concerns of performing an evaluation and management service by telephone and the availability of in person appointments. I also discussed with the patient that there may be a patient responsible charge related to this service. The patient expressed understanding and agreed to proceed.   I discussed the assessment and treatment plan with the patient. The patient was provided an opportunity to ask questions and all were answered. The patient agreed with the plan and demonstrated an understanding of the instructions.   The patient was advised to call back or seek an in-person evaluation if the symptoms worsen or if the condition fails to improve as anticipated.  I provided 30 minutes of non-face-to-face time during this encounter.  The patient was located at home.  The provider was located at [facility].   Dorothyann Gibbs, NP   Subjective:   [name] is a 35 y.o. (DOB 1986-10-11) female.  Chief Complaint: No chief complaint on file.   HPI Natalie [name] presents for follow-up of MDD, GAD, obsessional acts,  and ADHD.  Describes mood today as "ok". Pleasant. Mood symptoms - decreased depression, irritability, and anxiety Denies panic attacks. Stating "everything has just fell into place". She and mother have talked and worked things out. She and husband doing well - "things are a lot better". Decreased fifnancial stressors. Stable interest and motivation. Taking medications as prescribed.  Energy levels stable. Active, does not have a regular exercise routine.  Enjoys some usual interests and activities. Married. Lives with husband of 11 years and son age 91.  Appetite adequate. Weight loss  8 pounds - 162 from 170 pounds. Sleeps well most nights. Averages 8 hours.  Focus and concentration improved. Completing tasks. Managing aspects of household. Working full time as a Office manager. Denies SI or HI.  Denies AH or VH.  Previous medication trials: Effexor 37.5mg    Review of Systems:  Review of Systems  Musculoskeletal: Negative for gait problem.  Neurological: Negative for tremors.  Psychiatric/Behavioral:       Please refer to HPI    Medications: I have reviewed the patient's current medications.  Current Outpatient Medications  Medication Sig Dispense Refill  . amphetamine-dextroamphetamine (ADDERALL XR) 20 MG 24 hr capsule Take 1 capsule (20 mg total) by mouth daily. 30 capsule 0  . [START ON 01/02/2021] amphetamine-dextroamphetamine (ADDERALL XR) 20 MG 24 hr capsule Take 1 capsule (20 mg total) by mouth daily. 30 capsule 0  . [START ON 01/30/2021] amphetamine-dextroamphetamine (ADDERALL XR) 20 MG 24 hr capsule Take 1 capsule (20 mg total) by mouth daily. 30 capsule 0  . levonorgestrel (MIRENA) 20 MCG/24HR IUD by Intrauterine route.    . venlafaxine XR (EFFEXOR-XR) 37.5 MG 24 hr capsule TAKE 1 CAPSULE BY MOUTH DAILY WITH BREAKFAST for depression and anxiety. 90 capsule 3   No current facility-administered medications for this visit.    Medication Side Effects: None  Allergies: No Known Allergies  No past medical history on file.  Family History  Problem Relation Age of Onset  . Cancer Maternal Aunt        breast  . Hypertension Paternal Grandmother   . Diabetes Paternal Grandfather     Social History  Socioeconomic History  . Marital status: Married    Spouse name: Not on file  . Number of children: Not on file  . Years of education: Not on file  . Highest education level: Not on file  Occupational History  . Not on file  Tobacco Use  . Smoking status: Never Smoker  . Smokeless tobacco: Never Used  Substance and Sexual Activity  . Alcohol use: Yes     Comment: occassionally  . Drug use: No  . Sexual activity: Not on file  Other Topics Concern  . Not on file  Social History Narrative   Married.   1 child.   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   Social Determinants of Health   Financial Resource Strain: Not on file  Food Insecurity: Not on file  Transportation Needs: Not on file  Physical Activity: Not on file  Stress: Not on file  Social Connections: Not on file  Intimate Partner Violence: Not on file    Past Medical History, Surgical history, Social history, and Family history were reviewed and updated as appropriate.   Please see review of systems for further details on the patient's review from today.   Objective:   Physical Exam:  There were no vitals taken for this visit.  Physical Exam Neurological:     Mental Status: She is alert and oriented to person, place, and time.     Cranial Nerves: No dysarthria.  Psychiatric:        Attention and Perception: Attention and perception normal.        Mood and Affect: Mood normal.        Speech: Speech normal.        Behavior: Behavior is cooperative.        Thought Content: Thought content normal. Thought content is not paranoid or delusional. Thought content does not include homicidal or suicidal ideation. Thought content does not include homicidal or suicidal plan.        Cognition and Memory: Cognition and memory normal.        Judgment: Judgment normal.     Comments: Insight intact     Lab Review:     Component Value Date/Time   NA 138 09/30/2020 1542   K 4.1 09/30/2020 1542   CL 105 09/30/2020 1542   CO2 28 09/30/2020 1542   GLUCOSE 82 09/30/2020 1542   BUN 20 09/30/2020 1542   CREATININE 0.87 09/30/2020 1542   CREATININE 0.73 12/29/2013 1652   CREATININE 0.73 12/29/2013 1652   CALCIUM 9.5 09/30/2020 1542   PROT 7.1 09/30/2020 1542   ALBUMIN 4.3 09/30/2020 1542   AST 23 09/30/2020 1542   ALT 23  09/30/2020 1542   ALKPHOS 61 09/30/2020 1542   BILITOT 0.3 09/30/2020 1542       Component Value Date/Time   WBC 6.0 09/30/2020 1542   RBC 4.25 09/30/2020 1542   HGB 13.2 09/30/2020 1542   HCT 39.0 09/30/2020 1542   PLT 217.0 09/30/2020 1542   MCV 91.9 09/30/2020 1542   MCH 31.0 10/24/2014 0703   MCHC 33.7 09/30/2020 1542   RDW 12.5 09/30/2020 1542   LYMPHSABS 1.6 12/05/2012 0812   MONOABS 0.6 12/05/2012 0812   EOSABS 0.2 12/05/2012 0812   BASOSABS 0.0 12/05/2012 0812    No results found for: POCLITH, LITHIUM   No results found for: PHENYTOIN, PHENOBARB, VALPROATE, CBMZ   .res Assessment: Plan:    Plan:  PDMP reviewed  1. Continue Adderall XR 20mg  every morning. 2. Continue Effexor 37.5mg  daily   BP - teleheath - normal in December per pt.   RTC 3 months  Patient advised to contact office with any questions, adverse effects, or acute worsening in signs and symptoms.  Discussed potential benefits, risks, and side effects of stimulants with patient to include increased heart rate, palpitations, insomnia, increased anxiety, increased irritability, or decreased appetite.  Instructed patient to contact office if experiencing any significant tolerability issues.   Diagnoses and all orders for this visit:  Attention deficit hyperactivity disorder (ADHD), unspecified ADHD type -     amphetamine-dextroamphetamine (ADDERALL XR) 20 MG 24 hr capsule; Take 1 capsule (20 mg total) by mouth daily. -     amphetamine-dextroamphetamine (ADDERALL XR) 20 MG 24 hr capsule; Take 1 capsule (20 mg total) by mouth daily. -     amphetamine-dextroamphetamine (ADDERALL XR) 20 MG 24 hr capsule; Take 1 capsule (20 mg total) by mouth daily.  Major depressive disorder, recurrent episode, moderate (HCC) -     venlafaxine XR (EFFEXOR-XR) 37.5 MG 24 hr capsule; TAKE 1 CAPSULE BY MOUTH DAILY WITH BREAKFAST for depression and anxiety.  Generalized anxiety disorder -     venlafaxine XR (EFFEXOR-XR)  37.5 MG 24 hr capsule; TAKE 1 CAPSULE BY MOUTH DAILY WITH BREAKFAST for depression and anxiety.  Mixed obsessional thoughts and acts -     venlafaxine XR (EFFEXOR-XR) 37.5 MG 24 hr capsule; TAKE 1 CAPSULE BY MOUTH DAILY WITH BREAKFAST for depression and anxiety.    Please see After Visit Summary for patient specific instructions.  No future appointments.  No orders of the defined types were placed in this encounter.     -------------------------------

## 2020-12-15 ENCOUNTER — Other Ambulatory Visit: Payer: Self-pay | Admitting: Adult Health

## 2020-12-15 DIAGNOSIS — F331 Major depressive disorder, recurrent, moderate: Secondary | ICD-10-CM

## 2020-12-15 DIAGNOSIS — F411 Generalized anxiety disorder: Secondary | ICD-10-CM

## 2020-12-30 ENCOUNTER — Telehealth: Payer: Self-pay

## 2021-01-16 ENCOUNTER — Ambulatory Visit (INDEPENDENT_AMBULATORY_CARE_PROVIDER_SITE_OTHER): Payer: BC Managed Care – PPO | Admitting: Psychology

## 2021-01-16 DIAGNOSIS — F410 Panic disorder [episodic paroxysmal anxiety] without agoraphobia: Secondary | ICD-10-CM

## 2021-01-23 ENCOUNTER — Ambulatory Visit: Payer: BC Managed Care – PPO | Admitting: Psychology

## 2021-01-30 ENCOUNTER — Ambulatory Visit (INDEPENDENT_AMBULATORY_CARE_PROVIDER_SITE_OTHER): Payer: BC Managed Care – PPO | Admitting: Psychology

## 2021-01-30 DIAGNOSIS — F410 Panic disorder [episodic paroxysmal anxiety] without agoraphobia: Secondary | ICD-10-CM

## 2021-02-28 ENCOUNTER — Ambulatory Visit: Payer: BC Managed Care – PPO | Admitting: Psychology

## 2021-03-06 ENCOUNTER — Ambulatory Visit (INDEPENDENT_AMBULATORY_CARE_PROVIDER_SITE_OTHER): Payer: BC Managed Care – PPO | Admitting: Psychology

## 2021-03-06 DIAGNOSIS — F410 Panic disorder [episodic paroxysmal anxiety] without agoraphobia: Secondary | ICD-10-CM | POA: Diagnosis not present

## 2021-03-26 ENCOUNTER — Encounter: Payer: Self-pay | Admitting: Adult Health

## 2021-03-26 ENCOUNTER — Ambulatory Visit: Payer: BC Managed Care – PPO | Admitting: Psychology

## 2021-03-26 ENCOUNTER — Telehealth (INDEPENDENT_AMBULATORY_CARE_PROVIDER_SITE_OTHER): Payer: BC Managed Care – PPO | Admitting: Adult Health

## 2021-03-26 DIAGNOSIS — F411 Generalized anxiety disorder: Secondary | ICD-10-CM

## 2021-03-26 DIAGNOSIS — F422 Mixed obsessional thoughts and acts: Secondary | ICD-10-CM | POA: Diagnosis not present

## 2021-03-26 DIAGNOSIS — F909 Attention-deficit hyperactivity disorder, unspecified type: Secondary | ICD-10-CM | POA: Diagnosis not present

## 2021-03-26 DIAGNOSIS — F331 Major depressive disorder, recurrent, moderate: Secondary | ICD-10-CM | POA: Diagnosis not present

## 2021-03-26 MED ORDER — AMPHETAMINE-DEXTROAMPHET ER 20 MG PO CP24: 20.0000 mg | ORAL_CAPSULE | Freq: Every day | ORAL | 0 refills | Status: DC

## 2021-03-26 MED ORDER — VENLAFAXINE HCL ER 37.5 MG PO CP24: ORAL_CAPSULE | ORAL | 3 refills | Status: DC

## 2021-03-26 NOTE — Progress Notes (Signed)
Natalie [name] [id number] 1986/08/09 35 y.o.  Virtual Visit via Telephone Note  I connected with pt on 03/26/21 at  8:00 AM EDT by telephone and verified that I am speaking with the correct person using two identifiers.   I discussed the limitations, risks, security and privacy concerns of performing an evaluation and management service by telephone and the availability of in person appointments. I also discussed with the patient that there may be a patient responsible charge related to this service. The patient expressed understanding and agreed to proceed.   I discussed the assessment and treatment plan with the patient. The patient was provided an opportunity to ask questions and all were answered. The patient agreed with the plan and demonstrated an understanding of the instructions.   The patient was advised to call back or seek an in-person evaluation if the symptoms worsen or if the condition fails to improve as anticipated.  I provided 30 minutes of non-face-to-face time during this encounter.  The patient was located at home.  The provider was located at [facility].   Dorothyann Gibbs, NP   Subjective:   [name] is a 35 y.o. (DOB 11/06/1985) female.  Chief Complaint: No chief complaint on file.   HPI Natalie [name] presents for follow-up of MDD, GAD, obsessional acts,  and ADHD.  Describes mood today as "ok". Pleasant. Mood symptoms - decreased depression, irritability, and anxiety. Has stopped picking at fingers. Denies panic attacks. Stating "I have really been ok". She and mother's relationship improving. She and husband working on relationship. Decreased fifnancial stressors. Working with [name] - therapist. Stable interest and motivation. Taking medications as prescribed.  Energy levels stable. Active, does not have a regular exercise routine.  Enjoys some usual interests and activities. Married. Lives with husband and son age 27.  Appetite  adequate. Weight consistent - 162 from 170 pounds. Sleeps well most nights. Averages 8 hours.  Focus and concentration improved. Completing tasks. Managing aspects of household. Working full time as a Office manager. Denies SI or HI.  Denies AH or VH.  Previous medication trials: Effexor 37.5mg   Review of Systems:  Review of Systems  Musculoskeletal:  Negative for gait problem.  Neurological:  Negative for tremors.  Psychiatric/Behavioral:         Please refer to HPI   Medications: I have reviewed the patient's current medications.  Current Outpatient Medications  Medication Sig Dispense Refill   amphetamine-dextroamphetamine (ADDERALL XR) 20 MG 24 hr capsule Take 1 capsule (20 mg total) by mouth daily. 30 capsule 0   [START ON 04/23/2021] amphetamine-dextroamphetamine (ADDERALL XR) 20 MG 24 hr capsule Take 1 capsule (20 mg total) by mouth daily. 30 capsule 0   [START ON 05/21/2021] amphetamine-dextroamphetamine (ADDERALL XR) 20 MG 24 hr capsule Take 1 capsule (20 mg total) by mouth daily. 30 capsule 0   levonorgestrel (MIRENA) 20 MCG/24HR IUD by Intrauterine route.     venlafaxine XR (EFFEXOR-XR) 37.5 MG 24 hr capsule TAKE 1 CAPSULE BY MOUTH DAILY WITH BREAKFAST for depression and anxiety. 90 capsule 3   No current facility-administered medications for this visit.    Medication Side Effects: None  Allergies: No Known Allergies  No past medical history on file.  Family History  Problem Relation Age of Onset   Cancer Maternal Aunt        breast   Hypertension Paternal Grandmother    Diabetes Paternal Grandfather     Social History   Socioeconomic  History   Marital status: Married    Spouse name: Not on file   Number of children: Not on file   Years of education: Not on file   Highest education level: Not on file  Occupational History   Not on file  Tobacco Use   Smoking status: Never   Smokeless tobacco: Never  Substance and Sexual Activity   Alcohol use: Yes    Comment:  occassionally   Drug use: No   Sexual activity: Not on file  Other Topics Concern   Not on file  Social History Narrative   Married.   1 child.   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   Social Determinants of Health   Financial Resource Strain: Not on file  Food Insecurity: Not on file  Transportation Needs: Not on file  Physical Activity: Not on file  Stress: Not on file  Social Connections: Not on file  Intimate Partner Violence: Not on file    Past Medical History, Surgical history, Social history, and Family history were reviewed and updated as appropriate.   Please see review of systems for further details on the patient's review from today.   Objective:   Physical Exam:  There were no vitals taken for this visit.  Physical Exam Neurological:     Mental Status: She is alert and oriented to person, place, and time.     Cranial Nerves: No dysarthria.  Psychiatric:        Attention and Perception: Attention and perception normal.        Mood and Affect: Mood normal.        Speech: Speech normal.        Behavior: Behavior is cooperative.        Thought Content: Thought content normal. Thought content is not paranoid or delusional. Thought content does not include homicidal or suicidal ideation. Thought content does not include homicidal or suicidal plan.        Cognition and Memory: Cognition and memory normal.        Judgment: Judgment normal.     Comments: Insight intact    Lab Review:     Component Value Date/Time   NA 138 09/30/2020 1542   K 4.1 09/30/2020 1542   CL 105 09/30/2020 1542   CO2 28 09/30/2020 1542   GLUCOSE 82 09/30/2020 1542   BUN 20 09/30/2020 1542   CREATININE 0.87 09/30/2020 1542   CREATININE 0.73 12/29/2013 1652   CREATININE 0.73 12/29/2013 1652   CALCIUM 9.5 09/30/2020 1542   PROT 7.1 09/30/2020 1542   ALBUMIN 4.3 09/30/2020 1542   AST 23 09/30/2020 1542   ALT 23 09/30/2020 1542    ALKPHOS 61 09/30/2020 1542   BILITOT 0.3 09/30/2020 1542       Component Value Date/Time   WBC 6.0 09/30/2020 1542   RBC 4.25 09/30/2020 1542   HGB 13.2 09/30/2020 1542   HCT 39.0 09/30/2020 1542   PLT 217.0 09/30/2020 1542   MCV 91.9 09/30/2020 1542   MCH 31.0 10/24/2014 0703   MCHC 33.7 09/30/2020 1542   RDW 12.5 09/30/2020 1542   LYMPHSABS 1.6 12/05/2012 0812   MONOABS 0.6 12/05/2012 0812   EOSABS 0.2 12/05/2012 0812   BASOSABS 0.0 12/05/2012 0812    No results found for: POCLITH, LITHIUM   No results found for: PHENYTOIN, PHENOBARB, VALPROATE, CBMZ   .res Assessment: Plan:     Plan:  PDMP reviewed  1. Continue Adderall XR 20mg  every morning. 2. Continue Effexor 37.5mg  daily   BP - teleheath - WNL per patient.  RTC 3 months  Patient advised to contact office with any questions, adverse effects, or acute worsening in signs and symptoms.  Discussed potential benefits, risks, and side effects of stimulants with patient to include increased heart rate, palpitations, insomnia, increased anxiety, increased irritability, or decreased appetite.  Instructed patient to contact office if experiencing any significant tolerability issues.   Diagnoses and all orders for this visit:  Mixed obsessional thoughts and acts -     venlafaxine XR (EFFEXOR-XR) 37.5 MG 24 hr capsule; TAKE 1 CAPSULE BY MOUTH DAILY WITH BREAKFAST for depression and anxiety.  Generalized anxiety disorder -     venlafaxine XR (EFFEXOR-XR) 37.5 MG 24 hr capsule; TAKE 1 CAPSULE BY MOUTH DAILY WITH BREAKFAST for depression and anxiety.  Major depressive disorder, recurrent episode, moderate (HCC) -     venlafaxine XR (EFFEXOR-XR) 37.5 MG 24 hr capsule; TAKE 1 CAPSULE BY MOUTH DAILY WITH BREAKFAST for depression and anxiety.  Attention deficit hyperactivity disorder (ADHD), unspecified ADHD type -     amphetamine-dextroamphetamine (ADDERALL XR) 20 MG 24 hr capsule; Take 1 capsule (20 mg total) by mouth  daily. -     amphetamine-dextroamphetamine (ADDERALL XR) 20 MG 24 hr capsule; Take 1 capsule (20 mg total) by mouth daily. -     amphetamine-dextroamphetamine (ADDERALL XR) 20 MG 24 hr capsule; Take 1 capsule (20 mg total) by mouth daily.   Please see After Visit Summary for patient specific instructions.  Future Appointments  Date Time Provider Department Center  04/09/2021  1:00 PM 04/11/2021 [facility] None  04/21/2021  2:00 PM 06/22/2021 [facility] None  05/05/2021  2:00 PM 05/07/2021 [facility] None    No orders of the defined types were placed in this encounter.     -------------------------------

## 2021-04-01 ENCOUNTER — Telehealth: Payer: Self-pay | Admitting: Adult Health

## 2021-04-01 ENCOUNTER — Other Ambulatory Visit: Payer: Self-pay

## 2021-04-01 DIAGNOSIS — F909 Attention-deficit hyperactivity disorder, unspecified type: Secondary | ICD-10-CM

## 2021-04-01 MED ORDER — AMPHETAMINE-DEXTROAMPHET ER 20 MG PO CP24: 20.0000 mg | ORAL_CAPSULE | Freq: Every day | ORAL | 0 refills | Status: DC

## 2021-04-01 NOTE — Telephone Encounter (Signed)
Pt called requesting to have June Rx for Adderall XR 20 mg  send to [facility] [address] [facility].   Also, Pt asking if possible can you give her 31 day Rx in lieu of 30? [facility] and they will not even let her get 1 day early. That's why she's asking that.

## 2021-04-01 NOTE — Telephone Encounter (Signed)
Pended.

## 2021-04-01 NOTE — Telephone Encounter (Signed)
Ok - sorry - pend to the pharmacy and I will send.

## 2021-04-01 NOTE — Telephone Encounter (Signed)
Ok to pend. Can only do a 30 day supply.

## 2021-04-01 NOTE — Telephone Encounter (Signed)
Yes but the issue is her pharmacy is out of stock

## 2021-04-01 NOTE — Telephone Encounter (Signed)
On the message you said Rx sent but this was refused.

## 2021-04-01 NOTE — Telephone Encounter (Signed)
There are three scripts out there already.

## 2021-04-01 NOTE — Telephone Encounter (Signed)
Scripts sent

## 2021-04-09 ENCOUNTER — Ambulatory Visit (INDEPENDENT_AMBULATORY_CARE_PROVIDER_SITE_OTHER): Payer: BC Managed Care – PPO | Admitting: Psychology

## 2021-04-09 DIAGNOSIS — F410 Panic disorder [episodic paroxysmal anxiety] without agoraphobia: Secondary | ICD-10-CM

## 2021-04-21 ENCOUNTER — Ambulatory Visit: Payer: BC Managed Care – PPO | Admitting: Psychology

## 2021-04-24 ENCOUNTER — Ambulatory Visit (INDEPENDENT_AMBULATORY_CARE_PROVIDER_SITE_OTHER): Payer: BC Managed Care – PPO | Admitting: Psychology

## 2021-04-24 DIAGNOSIS — F410 Panic disorder [episodic paroxysmal anxiety] without agoraphobia: Secondary | ICD-10-CM | POA: Diagnosis not present

## 2021-05-05 ENCOUNTER — Ambulatory Visit: Payer: BC Managed Care – PPO | Admitting: Psychology

## 2021-05-12 ENCOUNTER — Ambulatory Visit (INDEPENDENT_AMBULATORY_CARE_PROVIDER_SITE_OTHER): Payer: BC Managed Care – PPO | Admitting: Psychology

## 2021-05-12 DIAGNOSIS — F410 Panic disorder [episodic paroxysmal anxiety] without agoraphobia: Secondary | ICD-10-CM

## 2021-05-26 ENCOUNTER — Ambulatory Visit (INDEPENDENT_AMBULATORY_CARE_PROVIDER_SITE_OTHER): Payer: BC Managed Care – PPO | Admitting: Psychology

## 2021-05-26 DIAGNOSIS — F410 Panic disorder [episodic paroxysmal anxiety] without agoraphobia: Secondary | ICD-10-CM

## 2021-06-04 ENCOUNTER — Ambulatory Visit: Payer: BC Managed Care – PPO | Admitting: Psychology

## 2021-07-01 ENCOUNTER — Telehealth: Payer: Self-pay | Admitting: Physician Assistant

## 2021-07-01 ENCOUNTER — Other Ambulatory Visit: Payer: Self-pay

## 2021-07-01 DIAGNOSIS — F909 Attention-deficit hyperactivity disorder, unspecified type: Secondary | ICD-10-CM

## 2021-07-01 MED ORDER — AMPHETAMINE-DEXTROAMPHET ER 20 MG PO CP24: 20.0000 mg | ORAL_CAPSULE | Freq: Every day | ORAL | 0 refills | Status: DC

## 2021-07-01 NOTE — Telephone Encounter (Signed)
pended

## 2021-07-01 NOTE — Telephone Encounter (Signed)
Script sent  

## 2021-07-01 NOTE — Telephone Encounter (Signed)
Pt called for refill of [facility] to [facility] [place]. She is going to check with her insurance to see if a telephone call visit is covered by her insurance and will call back to schedule.

## 2021-07-03 ENCOUNTER — Telehealth: Payer: Self-pay

## 2021-07-03 NOTE — Telephone Encounter (Signed)
Per access nurse note pt given pt advice and pt is scheduled for appt 07/04/21 at 3 pm with Audria Nine NPsendingnote to Iven Finn, Eather Colas CMA and will teams anastasiya.

## 2021-07-03 NOTE — Telephone Encounter (Signed)
[facility] - Client TELEPHONE ADVICE RECORD AccessNurse Patient Name: Natalie [name] Gender: Unknown DOB: April 13, 1986 Age: 35 Y 4 M 29 D Return Phone Number: [phone] (Primary) Address: City/ State/ Zip: [place] [address] Client [name] - Client Client Site [facility] - Day Physician Vernona Rieger - NP Contact Type Call Who Is Calling Patient / Member / Family / Caregiver Call Type Triage / Clinical Relationship To Patient Self Return Phone Number [phone] (Primary) Chief Complaint Abdominal Pain Reason for Call Symptomatic / Request for Health Information Initial Comment Caller has a patient on the phone with pt having having in pain her stomach, Additional Comment For 2 weeks patient, has been having random pains in side. Translation No Nurse Assessment Nurse: [name], RN, [name] Date/Time [name] Time): 07/03/2021 3:58:31 PM Confirm and document reason for call. If symptomatic, describe symptoms. ---Caller states that she is having pain for 2 weeks has been having random pains in side. Comes and goes. Rates pain as severe but brief. On right side feels like a muscle that is sore. Doesn't happen ever day. Does the patient have any new or worsening symptoms? ---Yes Will a triage be completed? ---Yes Related visit to physician within the last 2 weeks? ---No Does the PT have any chronic conditions? (i.e. diabetes, asthma, this includes High risk factors for pregnancy, etc.) ---Yes List chronic conditions. ---back surgery Is the patient pregnant or possibly pregnant? (Ask all females between the ages of 64-55) ---No Is this a behavioral health or substance abuse call? ---No Guidelines Guideline Title Affirmed Question Affirmed Notes Nurse Date/Time (Eastern Time) Flank Pain MODERATE pain (e.g., interferes with normal activities or awakens from sleep) Gust Brooms 07/03/2021 4:01:28 PM PLEASE NOTE: All timestamps contained within this report are represented as [place] Standard Time. CONFIDENTIALTY NOTICE: This fax transmission is intended only for the addressee. It contains information that is legally privileged, confidential or otherwise protected from use or disclosure. If you are not the intended recipient, you are strictly prohibited from reviewing, disclosing, copying using or disseminating any of this information or taking any action in reliance on or regarding this information. If you have received this fax in error, please notify us immediately by telephone so that we can arrange for its return to [place]. Phone: [phone], Toll-Free: [phone], Fax: [phone] Page: 2 of 2 Call Id: [id number] Disp. Time [name] Time) Disposition Final User 07/03/2021 4:07:23 PM See PCP within 24 Hours Yes [name], RN, Dawayne Cirri Disagree/Comply Comply Caller Understands Yes PreDisposition Call Doctor Care Advice Given Per Guideline SEE PCP WITHIN 24 HOURS: * IF OFFICE WILL BE OPEN: You need to be examined within the next 24 hours. Call your doctor (or NP/PA) when the office opens and make an appointment. CALL BACK IF: * Fever over 100.4 F (38.0 C) * You become worse CARE ADVICE given per Flank Pain (Adult) guideline. Comments User: Phillips Grout, RN Date/Time [name] Time): 07/03/2021 4:08:40 PM Appointment scheduled for 3pm tomorrow. Referrals REFERRED TO PCP OFFICE

## 2021-07-04 ENCOUNTER — Other Ambulatory Visit: Payer: Self-pay

## 2021-07-04 ENCOUNTER — Ambulatory Visit: Payer: BC Managed Care – PPO | Admitting: Nurse Practitioner

## 2021-07-04 ENCOUNTER — Encounter: Payer: Self-pay | Admitting: Nurse Practitioner

## 2021-07-04 VITALS — BP 114/82 | HR 78 | Temp 97.8°F | Ht 64.0 in | Wt 173.0 lb

## 2021-07-04 DIAGNOSIS — R1030 Lower abdominal pain, unspecified: Secondary | ICD-10-CM | POA: Insufficient documentation

## 2021-07-04 LAB — POCT URINE PREGNANCY: Preg Test, Ur: NEGATIVE

## 2021-07-04 LAB — POCT URINALYSIS DIP (MANUAL ENTRY)
Bilirubin, UA: NEGATIVE
Blood, UA: NEGATIVE
Glucose, UA: NEGATIVE mg/dL
Ketones, POC UA: NEGATIVE mg/dL
Leukocytes, UA: NEGATIVE
Nitrite, UA: NEGATIVE
Spec Grav, UA: 1.015 (ref 1.010–1.025)
Urobilinogen, UA: 1 E.U./dL
pH, UA: 6 (ref 5.0–8.0)

## 2021-07-04 NOTE — Patient Instructions (Signed)
Sounds to be musculoskeletal in nature Try to watch the activities we do Will check some labs today. Follow up if symptoms fail to imporve

## 2021-07-04 NOTE — Assessment & Plan Note (Signed)
No history of kidney stones, uterine fibroids, ovarian cysts.  Patient has no vaginal or urinary symptoms.  Sounds more musculoskeletal upon investigation.  Cannot elicit symptoms in office.  Patient has not tried anything over-the-counter.  As the discomfort abates in a few minutes after onset.  Did discuss with patient possibility of needing needing repeat pelvic exam will defer today we will check basic labs pending results.  Continue to monitor.  Did encourage OTC analgesic consumption when pain happens and also try to retrace her steps of what she has been doing when the pain does happen.  For report she has been doing something with lifting and twisting and the pain happens but then abates.

## 2021-07-04 NOTE — Progress Notes (Signed)
Acute Office Visit  Subjective:    Patient ID: Natalie [name], female    DOB: 1986-03-17, 35 y.o.   MRN: [id number]  Chief Complaint  Patient presents with   Abdominal Pain     Patient is in today for Abdominal Pain  Symptoms started last week. Not sure what she was doing. Was also dancing in the kitchen and came out of no where. States took trash out and then bent over and had the pain. Sharp stabing pain that lasted less than 5 minutes. Happened later that day. Has not tried anything over the counter.   Does have numbness and tingling down both legs Has a history back surgery and intermittent since.  Does have IUD. No regular periods History reviewed. No pertinent past medical history.  Past Surgical History:  Procedure Laterality Date   BACK SURGERY     DILATION AND EVACUATION N/A 04/30/2014   Procedure: DILATATION AND EVACUATION;  Surgeon: Philip Aspen, DO;  Location: [facility];  Service: Gynecology;  Laterality: N/A;   DILATION AND EVACUATION N/A 10/24/2014   Procedure: DILATATION AND EVACUATION with genetic studies;  Surgeon: Mitchel Honour, DO;  Location: [facility];  Service: Gynecology;  Laterality: N/A;  genetic studies    Family History  Problem Relation Age of Onset   Cancer Maternal Aunt        breast   Hypertension Paternal Grandmother    Diabetes Paternal Grandfather     Social History   Socioeconomic History   Marital status: Married    Spouse name: Not on file   Number of children: Not on file   Years of education: Not on file   Highest education level: Not on file  Occupational History   Not on file  Tobacco Use   Smoking status: Never   Smokeless tobacco: Never  Substance and Sexual Activity   Alcohol use: Yes    Comment: occassionally   Drug use: No   Sexual activity: Not on file  Other Topics Concern   Not on file  Social History Narrative   Married.   1 child.   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   Enjoys spending time  with family.   Social Determinants of Health   Financial Resource Strain: Not on file  Food Insecurity: Not on file  Transportation Needs: Not on file  Physical Activity: Not on file  Stress: Not on file  Social Connections: Not on file  Intimate Partner Violence: Not on file    Outpatient Medications Prior to Visit  Medication Sig Dispense Refill   amphetamine-dextroamphetamine (ADDERALL XR) 20 MG 24 hr capsule Take 1 capsule (20 mg total) by mouth daily. 30 capsule 0   amphetamine-dextroamphetamine (ADDERALL XR) 20 MG 24 hr capsule Take 1 capsule (20 mg total) by mouth daily. 30 capsule 0   amphetamine-dextroamphetamine (ADDERALL XR) 20 MG 24 hr capsule Take 1 capsule (20 mg total) by mouth daily. 30 capsule 0   levonorgestrel (MIRENA) 20 MCG/24HR IUD by Intrauterine route.     venlafaxine XR (EFFEXOR-XR) 37.5 MG 24 hr capsule TAKE 1 CAPSULE BY MOUTH DAILY WITH BREAKFAST for depression and anxiety. 90 capsule 3   No facility-administered medications prior to visit.    No Known Allergies  Review of Systems  Constitutional:  Negative for chills and fever.  Respiratory:  Negative for shortness of breath.   Cardiovascular:  Negative for chest pain.  Gastrointestinal:  Positive for abdominal pain. Negative for constipation, diarrhea, nausea  and vomiting.  Genitourinary:  Negative for dysuria, frequency, hematuria, pelvic pain, urgency, vaginal bleeding, vaginal discharge and vaginal pain.  Musculoskeletal:  Positive for back pain (baseline history of back surgery).  Neurological:  Positive for numbness (baseline hx of back surgery). Negative for weakness.      Objective:    Physical Exam Vitals and nursing note reviewed.  Constitutional:      Appearance: She is well-developed.  Cardiovascular:     Rate and Rhythm: Normal rate and regular rhythm.     Pulses: Normal pulses.  Pulmonary:     Effort: Pulmonary effort is normal.     Breath sounds: Normal breath sounds.   Abdominal:     General: Bowel sounds are normal. There is no distension.     Palpations: There is no mass.     Tenderness: There is no abdominal tenderness.  Musculoskeletal:        General: No tenderness or signs of injury.     Right lower leg: No edema.     Left lower leg: No edema.     Comments: Lower extremity strength 5/5 FROM lower back and bilateral hips. No TTP on bilateral hips or pelvis  Neurological:     Mental Status: She is alert. Mental status is at baseline.     Deep Tendon Reflexes:     Reflex Scores:      Patellar reflexes are 2+ on the right side and 2+ on the left side. Psychiatric:        Mood and Affect: Mood normal.        Behavior: Behavior normal.        Thought Content: Thought content normal.        Judgment: Judgment normal.    BP 114/82   Pulse 78   Temp 97.8 F (36.6 C) (Temporal)   Ht 5\' 4"  (1.626 m)   Wt 173 lb (78.5 kg)   SpO2 97%   BMI 29.70 kg/m  Wt Readings from Last 3 Encounters:  07/04/21 173 lb (78.5 kg)  09/30/20 172 lb (78 kg)  09/20/19 180 lb 4 oz (81.8 kg)    Health Maintenance Due  Topic Date Due   HIV Screening  Never done   Hepatitis C Screening  Never done   INFLUENZA VACCINE  05/12/2021    There are no preventive care reminders to display for this patient.   Lab Results  Component Value Date   TSH 1.80 09/30/2016   Lab Results  Component Value Date   WBC 6.0 09/30/2020   HGB 13.2 09/30/2020   HCT 39.0 09/30/2020   MCV 91.9 09/30/2020   PLT 217.0 09/30/2020   Lab Results  Component Value Date   NA 138 09/30/2020   K 4.1 09/30/2020   CO2 28 09/30/2020   GLUCOSE 82 09/30/2020   BUN 20 09/30/2020   CREATININE 0.87 09/30/2020   BILITOT 0.3 09/30/2020   ALKPHOS 61 09/30/2020   AST 23 09/30/2020   ALT 23 09/30/2020   PROT 7.1 09/30/2020   ALBUMIN 4.3 09/30/2020   CALCIUM 9.5 09/30/2020   GFR 85.57 09/30/2020   Lab Results  Component Value Date   CHOL 212 (H) 09/30/2020   Lab Results  Component  Value Date   HDL 60.30 09/30/2020   Lab Results  Component Value Date   LDLCALC 138 (H) 09/30/2020   Lab Results  Component Value Date   TRIG 66.0 09/30/2020   Lab Results  Component Value Date   CHOLHDL  4 09/30/2020   Lab Results  Component Value Date   HGBA1C 5.4 09/30/2016       Assessment & Plan:   Problem List Items Addressed This Visit       Other   Lower abdominal pain - Primary    No history of kidney stones, uterine fibroids, ovarian cysts.  Patient has no vaginal or urinary symptoms.  Sounds more musculoskeletal upon investigation.  Cannot elicit symptoms in office.  Patient has not tried anything over-the-counter.  As the discomfort abates in a few minutes after onset.  Did discuss with patient possibility of needing needing repeat pelvic exam will defer today we will check basic labs pending results.  Continue to monitor.  Did encourage OTC analgesic consumption when pain happens and also try to retrace her steps of what she has been doing when the pain does happen.  For report she has been doing something with lifting and twisting and the pain happens but then abates.      Relevant Orders   POCT urinalysis dipstick (Completed)   POCT urine pregnancy (Completed)   CBC   COMPLETE METABOLIC PANEL WITH GFR   Lipase     No orders of the defined types were placed in this encounter.  This visit occurred during the SARS-CoV-2 public health emergency.  Safety protocols were in place, including screening questions prior to the visit, additional usage of staff PPE, and extensive cleaning of exam room while observing appropriate contact time as indicated for disinfecting solutions.   Audria Nine, NP

## 2021-07-05 LAB — COMPLETE METABOLIC PANEL WITH GFR
AG Ratio: 1.7 (calc) (ref 1.0–2.5)
ALT: 21 U/L (ref 6–29)
AST: 21 U/L (ref 10–30)
Albumin: 4.3 g/dL (ref 3.6–5.1)
Alkaline phosphatase (APISO): 63 U/L (ref 31–125)
BUN: 15 mg/dL (ref 7–25)
CO2: 30 mmol/L (ref 20–32)
Calcium: 10 mg/dL (ref 8.6–10.2)
Chloride: 105 mmol/L (ref 98–110)
Creat: 0.85 mg/dL (ref 0.50–0.97)
Globulin: 2.6 g/dL (calc) (ref 1.9–3.7)
Glucose, Bld: 101 mg/dL — ABNORMAL HIGH (ref 65–99)
Potassium: 4 mmol/L (ref 3.5–5.3)
Sodium: 140 mmol/L (ref 135–146)
Total Bilirubin: 0.4 mg/dL (ref 0.2–1.2)
Total Protein: 6.9 g/dL (ref 6.1–8.1)
eGFR: 90 mL/min/{1.73_m2} (ref >=60)

## 2021-07-05 LAB — CBC
HCT: 38.7 % (ref 35.0–45.0)
Hemoglobin: 13.2 g/dL (ref 11.7–15.5)
MCH: 31.2 pg (ref 27.0–33.0)
MCHC: 34.1 g/dL (ref 32.0–36.0)
MCV: 91.5 fL (ref 80.0–100.0)
MPV: 11.8 fL (ref 7.5–12.5)
Platelets: 235 10*3/uL (ref 140–400)
RBC: 4.23 10*6/uL (ref 3.80–5.10)
RDW: 12.3 % (ref 11.0–15.0)
WBC: 6.4 10*3/uL (ref 3.8–10.8)

## 2021-07-05 LAB — LIPASE: Lipase: 12 U/L (ref 7–60)

## 2021-08-13 NOTE — Telephone Encounter (Signed)
Error for refill request

## 2021-09-08 ENCOUNTER — Telehealth: Payer: Self-pay | Admitting: Adult Health

## 2021-09-08 ENCOUNTER — Other Ambulatory Visit: Payer: Self-pay

## 2021-09-08 DIAGNOSIS — F909 Attention-deficit hyperactivity disorder, unspecified type: Secondary | ICD-10-CM

## 2021-09-08 NOTE — Telephone Encounter (Signed)
Pended please schedule appt

## 2021-09-08 NOTE — Telephone Encounter (Signed)
Patient called in regarding refill for Adderall. Ph: [phone]. [facility]acy [facility] [address] [place], [place]

## 2021-09-09 MED ORDER — AMPHETAMINE-DEXTROAMPHET ER 20 MG PO CP24: 20.0000 mg | ORAL_CAPSULE | Freq: Every day | ORAL | 0 refills | Status: DC

## 2021-09-30 ENCOUNTER — Ambulatory Visit: Payer: BC Managed Care – PPO | Admitting: Adult Health

## 2021-09-30 ENCOUNTER — Encounter: Payer: Self-pay | Admitting: Adult Health

## 2021-09-30 ENCOUNTER — Other Ambulatory Visit: Payer: Self-pay

## 2021-09-30 DIAGNOSIS — F422 Mixed obsessional thoughts and acts: Secondary | ICD-10-CM | POA: Diagnosis not present

## 2021-09-30 DIAGNOSIS — F331 Major depressive disorder, recurrent, moderate: Secondary | ICD-10-CM | POA: Diagnosis not present

## 2021-09-30 DIAGNOSIS — F909 Attention-deficit hyperactivity disorder, unspecified type: Secondary | ICD-10-CM

## 2021-09-30 DIAGNOSIS — F411 Generalized anxiety disorder: Secondary | ICD-10-CM

## 2021-09-30 MED ORDER — AMPHETAMINE-DEXTROAMPHET ER 25 MG PO CP24: 25.0000 mg | ORAL_CAPSULE | Freq: Every day | ORAL | 0 refills | Status: DC

## 2021-09-30 MED ORDER — VENLAFAXINE HCL ER 37.5 MG PO CP24: ORAL_CAPSULE | ORAL | 3 refills | Status: DC

## 2021-09-30 NOTE — Progress Notes (Signed)
Natalie [name] [id number] August 17, 1986 35 y.o.  Subjective:   [name] is a 35 y.o. (DOB December 08, 1985) female.  Chief Complaint: No chief complaint on file.   HPI LEIAH GIANNOTTI presents to the office today for follow-up of MDD, GAD, obsessional acts,  and ADHD.  Describes mood today as "ok". Pleasant. Mood symptoms - denies depression, irritability, and anxiety. Reports a few  panic attacks, but nothing major. Picking skin on fingers more  - dry skin. Stating "I'm doing alright". She and family doing well. Working with [name] - therapist. Stable interest and motivation. Taking medications as prescribed.  Energy levels stable. Active, does not have a regular exercise routine.  Enjoys some usual interests and activities. Married. Lives with husband and son age 14. Family local. Appetite adequate. Weight stable between 170 and 175 pounds. Sleeps well most nights. Averages 8 hours.  Focus and concentration improved. Completing tasks. Managing aspects of household. 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 Denies SI or HI.  Denies AH or VH.  Previous medication trials: Effexor 37.5mg    GAD-7    Flowsheet Row Office Visit from 05/10/2018 in [facility]  Total GAD-7 Score 21      PHQ2-9    Flowsheet Row Office Visit from 07/04/2021 in [facility] Visit from 09/30/2020 in [facility] Visit from 05/10/2018 in [facility]  PHQ-2 Total Score 0 0 6  PHQ-9 Total Score 0 0 25        Review of Systems:  Review of Systems  Musculoskeletal:  Negative for gait problem.  Neurological:  Negative for tremors.  Psychiatric/Behavioral:         Please refer to HPI   Medications: I have reviewed the patient's current medications.  Current Outpatient Medications  Medication Sig Dispense Refill   amphetamine-dextroamphetamine (ADDERALL XR) 25 MG 24 hr capsule Take 1 capsule by mouth  daily. 30 capsule 0   [START ON 10/28/2021] amphetamine-dextroamphetamine (ADDERALL XR) 25 MG 24 hr capsule Take 1 capsule by mouth daily. 30 capsule 0   [START ON 11/25/2021] amphetamine-dextroamphetamine (ADDERALL XR) 25 MG 24 hr capsule Take 1 capsule by mouth daily. 30 capsule 0   levonorgestrel (MIRENA) 20 MCG/24HR IUD by Intrauterine route.     venlafaxine XR (EFFEXOR-XR) 37.5 MG 24 hr capsule TAKE 1 CAPSULE BY MOUTH DAILY WITH BREAKFAST for depression and anxiety. 90 capsule 3   No current facility-administered medications for this visit.    Medication Side Effects: None  Allergies: No Known Allergies  No past medical history on file.  Past Medical History, Surgical history, Social history, and Family history were reviewed and updated as appropriate.   Please see review of systems for further details on the patient's review from today.   Objective:   Physical Exam:  There were no vitals taken for this visit.  Physical Exam Constitutional:      General: She is not in acute distress. Musculoskeletal:        General: No deformity.  Neurological:     Mental Status: She is alert and oriented to person, place, and time.     Coordination: Coordination normal.  Psychiatric:        Attention and Perception: Attention and perception normal. She does not perceive auditory or visual hallucinations.        Mood and Affect: Mood normal. Mood is not anxious or depressed. Affect is not labile,  blunt, angry or inappropriate.        Speech: Speech normal.        Behavior: Behavior normal.        Thought Content: Thought content normal. Thought content is not paranoid or delusional. Thought content does not include homicidal or suicidal ideation. Thought content does not include homicidal or suicidal plan.        Cognition and Memory: Cognition and memory normal.        Judgment: Judgment normal.     Comments: Insight intact    Lab Review:     Component Value Date/Time   NA 140  07/04/2021 1605   K 4.0 07/04/2021 1605   CL 105 07/04/2021 1605   CO2 30 07/04/2021 1605   GLUCOSE 101 (H) 07/04/2021 1605   BUN 15 07/04/2021 1605   CREATININE 0.85 07/04/2021 1605   CALCIUM 10.0 07/04/2021 1605   PROT 6.9 07/04/2021 1605   ALBUMIN 4.3 09/30/2020 1542   AST 21 07/04/2021 1605   ALT 21 07/04/2021 1605   ALKPHOS 61 09/30/2020 1542   BILITOT 0.4 07/04/2021 1605       Component Value Date/Time   WBC 6.4 07/04/2021 1605   RBC 4.23 07/04/2021 1605   HGB 13.2 07/04/2021 1605   HCT 38.7 07/04/2021 1605   PLT 235 07/04/2021 1605   MCV 91.5 07/04/2021 1605   MCH 31.2 07/04/2021 1605   MCHC 34.1 07/04/2021 1605   RDW 12.3 07/04/2021 1605   LYMPHSABS 1.6 12/05/2012 0812   MONOABS 0.6 12/05/2012 0812   EOSABS 0.2 12/05/2012 0812   BASOSABS 0.0 12/05/2012 0812    No results found for: POCLITH, LITHIUM   No results found for: PHENYTOIN, PHENOBARB, VALPROATE, CBMZ   .res Assessment: Plan:    Plan:  PDMP reviewed  1. Continue Adderall XR 20mg  every morning. 2. Continue Effexor 37.5mg  daily   RTC 3 months  Patient advised to contact office with any questions, adverse effects, or acute worsening in signs and symptoms.  Discussed potential benefits, risks, and side effects of stimulants with patient to include increased heart rate, palpitations, insomnia, increased anxiety, increased irritability, or decreased appetite. Instructed patient to contact office if experiencing any significant tolerability issues.  Diagnoses and all orders for this visit:  Attention deficit hyperactivity disorder (ADHD), unspecified ADHD type -     amphetamine-dextroamphetamine (ADDERALL XR) 25 MG 24 hr capsule; Take 1 capsule by mouth daily. -     amphetamine-dextroamphetamine (ADDERALL XR) 25 MG 24 hr capsule; Take 1 capsule by mouth daily. -     amphetamine-dextroamphetamine (ADDERALL XR) 25 MG 24 hr capsule; Take 1 capsule by mouth daily.  Mixed obsessional thoughts and  acts -     venlafaxine XR (EFFEXOR-XR) 37.5 MG 24 hr capsule; TAKE 1 CAPSULE BY MOUTH DAILY WITH BREAKFAST for depression and anxiety.  Generalized anxiety disorder -     venlafaxine XR (EFFEXOR-XR) 37.5 MG 24 hr capsule; TAKE 1 CAPSULE BY MOUTH DAILY WITH BREAKFAST for depression and anxiety.  Major depressive disorder, recurrent episode, moderate (HCC) -     venlafaxine XR (EFFEXOR-XR) 37.5 MG 24 hr capsule; TAKE 1 CAPSULE BY MOUTH DAILY WITH BREAKFAST for depression and anxiety.     Please see After Visit Summary for patient specific instructions.  No future appointments.  No orders of the defined types were placed in this encounter.   -------------------------------

## 2021-10-08 ENCOUNTER — Telehealth: Payer: Self-pay | Admitting: Adult Health

## 2021-10-08 NOTE — Telephone Encounter (Signed)
Prior Approval received effective 10/08/2021-10/08/2024 for AMPHETAMINE-DEXTROAMPHETAMINE ER 25 MG with [facility].

## 2021-10-08 NOTE — Telephone Encounter (Signed)
Next appt is 12/29/21. Requesting refill on Adderall 20 mg called to:  [id number] [place], [place] - [address]  Phone:  [phone]  Fax:  [phone]

## 2021-10-08 NOTE — Telephone Encounter (Signed)
Patient had refills for 25 mg sent to the pharmacy.  Called patient to ask about it and she said she and Almira Coaster had discussed bumping up the dose but that a PA was needed. Traci said the PA had been approved. Called patient and let her know that the 25 mg dose had been approved, but that she would need to call the pharmacy and let them know so they could run it. Told her an Rx for the 20 mg would not be sent since the 25 mg had been approved.

## 2021-11-10 ENCOUNTER — Encounter: Payer: Self-pay | Admitting: Primary Care

## 2021-11-10 ENCOUNTER — Ambulatory Visit: Payer: BC Managed Care – PPO | Admitting: Primary Care

## 2021-11-10 ENCOUNTER — Other Ambulatory Visit: Payer: Self-pay

## 2021-11-10 VITALS — BP 118/62 | HR 97 | Temp 98.1°F | Ht 64.0 in | Wt 169.0 lb

## 2021-11-10 DIAGNOSIS — G8929 Other chronic pain: Secondary | ICD-10-CM

## 2021-11-10 DIAGNOSIS — M5442 Lumbago with sciatica, left side: Secondary | ICD-10-CM

## 2021-11-10 MED ORDER — KETOROLAC TROMETHAMINE 60 MG/2ML IM SOLN
60.0000 mg | Freq: Once | INTRAMUSCULAR | Status: AC
  Administered 2021-11-10: 60 mg via INTRAMUSCULAR

## 2021-11-10 MED ORDER — DICLOFENAC SODIUM 75 MG PO TBEC: 75.0000 mg | DELAYED_RELEASE_TABLET | Freq: Two times a day (BID) | ORAL | 0 refills | Status: DC | PRN

## 2021-11-10 NOTE — Progress Notes (Signed)
Subjective:    Patient ID: Natalie [name], female    DOB: Nov 25, 1985, 36 y.o.   MRN: [id number]  HPI  Natalie [name] is a very pleasant 36 y.o. female with a history of chronic back pain, who presents today to discuss back pain.  Acute on chronic left lower back pain with radiation down left lower extremity x 1 month. She's also noticed numbness to the left buttocks down to her toes.   A few days ago her pain increased to where she couldn't lay or sit. Later that day she was walking, slipped and fell onto her buttocks.   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 Other than this, she denies trauma or injury.  She completed an E-visit two days ago, was prescribed a Medrol Dose Pak and cyclobenzaprine 10 mg. She's noticed some improvement today. The cyclobenzaprine hasn't helped.   She denies loss of bowel/bladder control, weakness to lower extremities.    Review of Systems  Musculoskeletal:  Positive for arthralgias and back pain.  Neurological:  Positive for numbness. Negative for weakness.        History reviewed. No pertinent past medical history.  Social History   Socioeconomic History   Marital status: Married    Spouse name: Not on file   Number of children: Not on file   Years of education: Not on file   Highest education level: Not on file  Occupational History   Not on file  Tobacco Use   Smoking status: Never   Smokeless tobacco: Never  Substance and Sexual Activity   Alcohol use: Yes    Comment: occassionally   Drug use: No   Sexual activity: Not on file  Other Topics Concern   Not on file  Social History Narrative   Married.   1 child.   Works as an Surveyor, quantity at a childcare center.   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   Social Determinants of Health   Financial Resource Strain: Not on file  Food Insecurity: Not on file  Transportation Needs: Not on file  Physical Activity: Not on file   Stress: Not on file  Social Connections: Not on file  Intimate Partner Violence: Not on file    Past Surgical History:  Procedure Laterality Date   BACK SURGERY     DILATION AND EVACUATION N/A 04/30/2014   Procedure: DILATATION AND EVACUATION;  Surgeon: Allyn Kenner, DO;  Location: [facility];  Service: Gynecology;  Laterality: N/A;   DILATION AND EVACUATION N/A 10/24/2014   Procedure: DILATATION AND EVACUATION with genetic studies;  Surgeon: Linda Hedges, DO;  Location: [facility];  Service: Gynecology;  Laterality: N/A;  genetic studies    Family History  Problem Relation Age of Onset   Cancer Maternal Aunt        breast   Hypertension Paternal Grandmother    Diabetes Paternal Grandfather     No Known Allergies  Current Outpatient Medications on File Prior to Visit  Medication Sig Dispense Refill   amphetamine-dextroamphetamine (ADDERALL XR) 25 MG 24 hr capsule Take 1 capsule by mouth daily. 30 capsule 0   amphetamine-dextroamphetamine (ADDERALL XR) 25 MG 24 hr capsule Take 1 capsule by mouth daily. 30 capsule 0   [START ON 11/25/2021] amphetamine-dextroamphetamine (ADDERALL XR) 25 MG 24 hr capsule Take 1 capsule by mouth daily. 30 capsule 0   levonorgestrel (MIRENA) 20 MCG/24HR IUD by Intrauterine route.     venlafaxine  XR (EFFEXOR-XR) 37.5 MG 24 hr capsule TAKE 1 CAPSULE BY MOUTH DAILY WITH BREAKFAST for depression and anxiety. 90 capsule 3   cyclobenzaprine (FLEXERIL) 10 MG tablet Take 10 mg by mouth 3 (three) times daily as needed.     methylPREDNISolone (MEDROL DOSEPAK) 4 MG TBPK tablet TAKE 6 TABLETS ON DAY 1 AS DIRECTED ON PACKAGE AND DECREASE BY 1 TAB EACH DAY FOR A TOTAL OF 6 DAYS     No current facility-administered medications on file prior to visit.    BP 118/62    Pulse 97    Temp 98.1 F (36.7 C) (Temporal)    Ht 5\' 4"  (1.626 m)    Wt 169 lb (76.7 kg)    SpO2 97%    BMI 29.01 kg/m  Objective:   Physical Exam Cardiovascular:     Rate and Rhythm: Normal rate and  regular rhythm.  Pulmonary:     Effort: Pulmonary effort is normal.  Musculoskeletal:     Cervical back: Neck supple.     Lumbar back: No bony tenderness. Normal range of motion. Negative right straight leg raise test and negative left straight leg raise test.       Legs:  Skin:    General: Skin is warm and dry.          Assessment & Plan:      This visit occurred during the SARS-CoV-2 public health emergency.  Safety protocols were in place, including screening questions prior to the visit, additional usage of staff PPE, and extensive cleaning of exam room while observing appropriate contact time as indicated for disinfecting solutions.

## 2021-11-10 NOTE — Addendum Note (Signed)
Addended by: Donnamarie Poag on: 11/10/2021 03:40 PM   Modules accepted: Orders

## 2021-11-10 NOTE — Patient Instructions (Signed)
Continue the steroid prescription as prescribed.  You may take the diclofenac medication for pain twice daily as needed. Start this after you finish the steroid dose pack.  You will be contacted regarding your referral to physical therapy.  Please let us know if you have not been contacted within two weeks.   It was a pleasure to see you today!

## 2021-11-10 NOTE — Assessment & Plan Note (Signed)
Acute on chronic flare, no alarm signs on exam.  Discussed to continue Medrol Dose Pak as prescribed.  IM Toradol 60 mg provided today.  Rx for diclofenac 75 mg BID to take PRN post steroid completion provided.   Referral placed for PT.

## 2021-11-14 ENCOUNTER — Encounter: Payer: Self-pay | Admitting: *Deleted

## 2021-11-27 ENCOUNTER — Telehealth: Payer: Self-pay | Admitting: Primary Care

## 2021-11-27 DIAGNOSIS — G8929 Other chronic pain: Secondary | ICD-10-CM

## 2021-11-27 DIAGNOSIS — M5442 Lumbago with sciatica, left side: Secondary | ICD-10-CM

## 2021-11-27 NOTE — Telephone Encounter (Signed)
Pt called asking for a call back concerning the OV she had on 11/10/21. Please advise.

## 2021-11-28 NOTE — Telephone Encounter (Signed)
Called patient states that she has tried mediations that were given and she was not able to set up pt for a few weeks. Wanted to know if there was anything she can do for pain until she is able to get in to PT. States that pain is so bad she is not sleeping at all. Pain is in back into left leg.

## 2021-11-28 NOTE — Telephone Encounter (Signed)
We can try some gabapentin 100 mg. She can take 1 to 3 capsules by mouth twice daily. Have her start with 100 mg (1 cap) HS as this may cause drowsiness. She can titrate up slowly to 300 mg BID.   Let me know when you've spoken with her and I'll send in RX.

## 2021-12-01 MED ORDER — GABAPENTIN 100 MG PO CAPS: ORAL_CAPSULE | ORAL | 0 refills | Status: DC

## 2021-12-01 NOTE — Addendum Note (Signed)
Addended by: Doreene Nest on: 12/01/2021 01:56 PM   Modules accepted: Orders

## 2021-12-01 NOTE — Telephone Encounter (Signed)
Noted, prescription sent to pharmacy. 

## 2021-12-01 NOTE — Telephone Encounter (Signed)
Called patient reviewed all information and repeated back to me. Will call if any questions.  He would like to try. I have reviewed and had her repeat back to me how to start and titrate up if needed.

## 2021-12-29 ENCOUNTER — Ambulatory Visit (INDEPENDENT_AMBULATORY_CARE_PROVIDER_SITE_OTHER): Payer: Self-pay | Admitting: Adult Health

## 2021-12-29 DIAGNOSIS — F489 Nonpsychotic mental disorder, unspecified: Secondary | ICD-10-CM

## 2021-12-29 NOTE — Progress Notes (Signed)
Patient no show appointment. ? ?

## 2022-01-12 ENCOUNTER — Telehealth: Payer: Self-pay | Admitting: Adult Health

## 2022-01-12 ENCOUNTER — Other Ambulatory Visit: Payer: Self-pay | Admitting: Adult Health

## 2022-01-12 DIAGNOSIS — F909 Attention-deficit hyperactivity disorder, unspecified type: Secondary | ICD-10-CM

## 2022-01-12 MED ORDER — AMPHETAMINE-DEXTROAMPHET ER 25 MG PO CP24: 25.0000 mg | ORAL_CAPSULE | Freq: Every day | ORAL | 0 refills | Status: DC

## 2022-01-12 NOTE — Telephone Encounter (Signed)
Script sent  

## 2022-01-12 NOTE — Telephone Encounter (Signed)
Natalie [name]  called to request refill of her Adderall.  She made appt 01/21/22.  [facility] [place]  She said [facility] usually [facility] it. ?

## 2022-01-21 ENCOUNTER — Encounter: Payer: Self-pay | Admitting: Adult Health

## 2022-01-21 ENCOUNTER — Ambulatory Visit (INDEPENDENT_AMBULATORY_CARE_PROVIDER_SITE_OTHER): Payer: BC Managed Care – PPO | Admitting: Adult Health

## 2022-01-21 DIAGNOSIS — F422 Mixed obsessional thoughts and acts: Secondary | ICD-10-CM

## 2022-01-21 DIAGNOSIS — F331 Major depressive disorder, recurrent, moderate: Secondary | ICD-10-CM

## 2022-01-21 DIAGNOSIS — F411 Generalized anxiety disorder: Secondary | ICD-10-CM

## 2022-01-21 DIAGNOSIS — F909 Attention-deficit hyperactivity disorder, unspecified type: Secondary | ICD-10-CM | POA: Diagnosis not present

## 2022-01-21 MED ORDER — AMPHETAMINE-DEXTROAMPHET ER 25 MG PO CP24: 25.0000 mg | ORAL_CAPSULE | Freq: Every day | ORAL | 0 refills | Status: DC

## 2022-01-21 NOTE — Progress Notes (Signed)
Patient no show appointment. Natalie BaumgartnerKeri B [name] ?[id number] ?[birth year] ?[age] y.o. ? ?Virtual Visit via Telephone Note ? ?I connected with pt on 01/21/22 at  3:00 PM EDT by telephone and verified that I am speaking with the correct person using two identifiers. ?  ?I discussed the limitations, risks, security and privacy concerns of performing an evaluation and management service by telephone and the availability of in person appointments. I also discussed with the patient that there may be a patient responsible charge related to this service. The patient expressed understanding and agreed to proceed. ?  ?I discussed the assessment and treatment plan with the patient. The patient was provided an opportunity to ask questions and all were answered. The patient agreed with the plan and demonstrated an understanding of the instructions. ?  ?The patient was advised to call back or seek an in-person evaluation if the symptoms worsen or if the condition fails to improve as anticipated. ? ?I provided 25 minutes of non-face-to-face time during this encounter.  The patient was located at home.  [facility] ? ? ?Natalie Gibbsegina N Karista Aispuro, NP ? ? ?Subjective:  ? ?[name] is a 36 y.o. (DOB [birth year]) female. ? ?Chief Complaint: No chief complaint on file. ? ? ?HPI ?Natalie [name] presents for follow-up of MDD, GAD, obsessional acts and ADHD. ? ?Describes mood today as "ok". Pleasant. Mood symptoms - denies depression and anxiety. More irritable than normal. Denies panic attacks. Decreased skin picking - "it comes and goes". Stating "I feel like I'm doing alright".  ?She and family doing well. Working with Elisha PonderAnita Pardo - therapist. Stable interest and motivation. Taking medications as prescribed.  ?Energy levels stable. Active, does not have a regular exercise routine. Working with P/T. Walking more. ?Enjoys some usual interests and activities. Married. Lives with husband and son age 38. Family  local. ?Appetite adequate. Weight loss 165 pounds. ?Sleeps well most nights. Averages 8 hours.  ?Focus and concentration stable with medication. Completing tasks. Managing aspects of household. 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 ?Denies SI or HI.  ?Denies AH or VH. ?Denies self harm. ? ?Previous medication trials: Effexor 37.5mg  ? ? ?Review of Systems:  ?Review of Systems  ?Musculoskeletal:  Negative for gait problem.  ?Neurological:  Negative for tremors.  ?Psychiatric/Behavioral:    ?     Please refer to HPI  ? ?Medications: I have reviewed the patient's current medications. ? ?Current Outpatient Medications  ?Medication Sig Dispense Refill  ? amphetamine-dextroamphetamine (ADDERALL XR) 25 MG 24 hr capsule Take 1 capsule by mouth daily. 30 capsule 0  ? amphetamine-dextroamphetamine (ADDERALL XR) 25 MG 24 hr capsule Take 1 capsule by mouth daily. 30 capsule 0  ? amphetamine-dextroamphetamine (ADDERALL XR) 25 MG 24 hr capsule Take 1 capsule by mouth daily. 30 capsule 0  ? cyclobenzaprine (FLEXERIL) 10 MG tablet Take 10 mg by mouth 3 (three) times daily as needed.    ? diclofenac (VOLTAREN) 75 MG EC tablet Take 1 tablet (75 mg total) by mouth 2 (two) times daily as needed for moderate pain. 30 tablet 0  ? gabapentin (NEURONTIN) 100 MG capsule Take 1 to 3 capsules by mouth at bedtime for back pain. 90 capsule 0  ? levonorgestrel (MIRENA) 20 MCG/24HR IUD by Intrauterine route.    ? methylPREDNISolone (MEDROL DOSEPAK) 4 MG TBPK tablet TAKE 6 TABLETS ON DAY 1 AS DIRECTED ON PACKAGE AND DECREASE BY 1 TAB EACH DAY FOR A  TOTAL OF 6 DAYS    ? venlafaxine XR (EFFEXOR-XR) 37.5 MG 24 hr capsule TAKE 1 CAPSULE BY MOUTH DAILY WITH BREAKFAST for depression and anxiety. 90 capsule 3  ? ?No current facility-administered medications for this visit.  ? ? ?Medication Side Effects: None ? ?Allergies: No Known Allergies ? ?Past Medical History:  ?Diagnosis Date  ? Chronic back pain   ? ? ?Family History  ?Problem Relation Age of Onset  ?  Cancer Maternal Aunt   ?     breast  ? Hypertension Paternal Grandmother   ? Diabetes Paternal Grandfather   ? ? ?Social History  ? ?Socioeconomic History  ? Marital status: Married  ?  Spouse name: Not on file  ? Number of children: Not on file  ? Years of education: Not on file  ? Highest education level: Not on file  ?Occupational History  ? Not on file  ?Tobacco Use  ? Smoking status: Never  ? Smokeless tobacco: Never  ?Substance and Sexual Activity  ? Alcohol use: Yes  ?  Comment: occassionally  ? Drug use: No  ? Sexual activity: Not on file  ?Other Topics Concern  ? Not on file  ?Social History Narrative  ? Married.  ? 1 child.  ? 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  ? 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  ? ?Social Determinants of Health  ? ?Financial Resource Strain: Not on file  ?Food Insecurity: Not on file  ?Transportation Needs: Not on file  ?Physical Activity: Not on file  ?Stress: Not on file  ?Social Connections: Not on file  ?Intimate Partner Violence: Not on file  ? ? ?Past Medical History, Surgical history, Social history, and Family history were reviewed and updated as appropriate.  ? ?Please see review of systems for further details on the patient's review from today.  ? ?Objective:  ? ?Physical Exam:  ?There were no vitals taken for this visit. ? ?Physical Exam ?Constitutional:   ?   General: She is not in acute distress. ?Musculoskeletal:     ?   General: No deformity.  ?Neurological:  ?   Mental Status: She is alert and oriented to person, place, and time.  ?   Coordination: Coordination normal.  ?Psychiatric:     ?   Attention and Perception: Attention and perception normal. She does not perceive auditory or visual hallucinations.     ?   Mood and Affect: Mood normal. Mood is not anxious or depressed. Affect is not labile, blunt, angry or inappropriate.     ?   Speech: Speech normal.     ?   Behavior: Behavior normal.     ?   Thought Content: Thought content normal. Thought  content is not paranoid or delusional. Thought content does not include homicidal or suicidal ideation. Thought content does not include homicidal or suicidal plan.     ?   Cognition and Memory: Cognition and memory normal.     ?   Judgment: Judgment normal.  ?   Comments: Insight intact  ? ? ?Lab Review:  ?   ?Component Value Date/Time  ? NA 140 07/04/2021 1605  ? K 4.0 07/04/2021 1605  ? CL 105 07/04/2021 1605  ? CO2 30 07/04/2021 1605  ? GLUCOSE 101 (H) 07/04/2021 1605  ? BUN 15 07/04/2021 1605  ? CREATININE 0.85 07/04/2021 1605  ? CALCIUM 10.0 07/04/2021 1605  ? PROT 6.9 07/04/2021 1605  ? ALBUMIN 4.3 09/30/2020 1542  ?  AST 21 07/04/2021 1605  ? ALT 21 07/04/2021 1605  ? ALKPHOS 61 09/30/2020 1542  ? BILITOT 0.4 07/04/2021 1605  ? ? ?   ?Component Value Date/Time  ? WBC 6.4 07/04/2021 1605  ? RBC 4.23 07/04/2021 1605  ? HGB 13.2 07/04/2021 1605  ? HCT 38.7 07/04/2021 1605  ? PLT 235 07/04/2021 1605  ? MCV 91.5 07/04/2021 1605  ? MCH 31.2 07/04/2021 1605  ? MCHC 34.1 07/04/2021 1605  ? RDW 12.3 07/04/2021 1605  ? LYMPHSABS 1.6 12/05/2012 0812  ? MONOABS 0.6 12/05/2012 0812  ? EOSABS 0.2 12/05/2012 0812  ? BASOSABS 0.0 12/05/2012 2440  ? ? ?No results found for: POCLITH, LITHIUM  ? ?No results found for: PHENYTOIN, PHENOBARB, VALPROATE, CBMZ  ? ?.res ?Assessment: Plan:   ? ?Plan: ? ?PDMP reviewed ? ?1. Continue Adderall XR 20mg  every morning. ?2. Continue Effexor 37.5mg  daily  ? ?RTC 3 months ? ?Patient advised to contact office with any questions, adverse effects, or acute worsening in signs and symptoms. ? ?Discussed potential benefits, risks, and side effects of stimulants with patient to include increased heart rate, palpitations, insomnia, increased anxiety, increased irritability, or decreased appetite. Instructed patient to contact office if experiencing any significant tolerability issues. ? ?There are no diagnoses linked to this encounter.  ? ?Please see After Visit Summary for patient specific  instructions. ? ?No future appointments. ? ?No orders of the defined types were placed in this encounter. ? ? ?  ?------------------------------- ?

## 2022-03-04 ENCOUNTER — Telehealth: Payer: Self-pay | Admitting: Adult Health

## 2022-03-04 ENCOUNTER — Other Ambulatory Visit: Payer: Self-pay | Admitting: Adult Health

## 2022-03-04 DIAGNOSIS — F909 Attention-deficit hyperactivity disorder, unspecified type: Secondary | ICD-10-CM

## 2022-03-04 MED ORDER — AMPHETAMINE-DEXTROAMPHET ER 25 MG PO CP24: 25.0000 mg | ORAL_CAPSULE | Freq: Every day | ORAL | 0 refills | Status: DC

## 2022-03-04 NOTE — Telephone Encounter (Signed)
Pt called at 3:31 pm and asked for a refill on her adderall xr 25 mg. [facility] [address] in [place]

## 2022-03-04 NOTE — Telephone Encounter (Signed)
Script sent  

## 2022-03-09 ENCOUNTER — Encounter (INDEPENDENT_AMBULATORY_CARE_PROVIDER_SITE_OTHER): Payer: Self-pay

## 2022-04-10 ENCOUNTER — Telehealth: Payer: Self-pay | Admitting: Adult Health

## 2022-04-10 ENCOUNTER — Other Ambulatory Visit: Payer: Self-pay

## 2022-04-10 DIAGNOSIS — F909 Attention-deficit hyperactivity disorder, unspecified type: Secondary | ICD-10-CM

## 2022-04-10 MED ORDER — AMPHETAMINE-DEXTROAMPHET ER 25 MG PO CP24: 25.0000 mg | ORAL_CAPSULE | Freq: Every day | ORAL | 0 refills | Status: DC

## 2022-04-10 NOTE — Telephone Encounter (Signed)
Pt requesting Rx for generic adderall XR 25 mg to new pharmacy  [facility] [address] [place]. [address]. Send all meds going forward to [facility]

## 2022-04-10 NOTE — Telephone Encounter (Signed)
Pended.

## 2022-04-27 ENCOUNTER — Encounter: Payer: Self-pay | Admitting: Adult Health

## 2022-04-27 ENCOUNTER — Telehealth (INDEPENDENT_AMBULATORY_CARE_PROVIDER_SITE_OTHER): Payer: BC Managed Care – PPO | Admitting: Adult Health

## 2022-04-27 DIAGNOSIS — F909 Attention-deficit hyperactivity disorder, unspecified type: Secondary | ICD-10-CM

## 2022-04-27 MED ORDER — AMPHETAMINE-DEXTROAMPHET ER 25 MG PO CP24: 25.0000 mg | ORAL_CAPSULE | Freq: Every day | ORAL | 0 refills | Status: DC

## 2022-04-27 NOTE — Progress Notes (Signed)
Natalie [name] [id number] Feb 20, 1986 36 y.o.  Virtual Visit via Video Note  I connected with pt @ on 04/27/22 at  8:20 AM EDT by a video enabled telemedicine application and verified that I am speaking with the correct person using two identifiers.   I discussed the limitations of evaluation and management by telemedicine and the availability of in person appointments. The patient expressed understanding and agreed to proceed.  I discussed the assessment and treatment plan with the patient. The patient was provided an opportunity to ask questions and all were answered. The patient agreed with the plan and demonstrated an understanding of the instructions.   The patient was advised to call back or seek an in-person evaluation if the symptoms worsen or if the condition fails to improve as anticipated.  I provided 25 minutes of non-face-to-face time during this encounter.  The patient was located at home.  The provider was located at [facility].   Dorothyann Gibbs, NP   Subjective:   [name] is a 36 y.o. (DOB 29-Dec-1985) female.  Chief Complaint: No chief complaint on file.   HPI Natalie [name] presents for follow-up of MDD, GAD, obsessional acts and ADHD.  Describes mood today as "ok". Pleasant. Mood symptoms - denies depression and anxiety. Fe". els irritable at times - "nothing out of the ordinary". Denies panic attacks. Decreased skin picking - keeping nails short. Stating "I'm doing good". She and family doing well. Son transitioning to middle school. Planning to cut back on outside commitments this upcoming year. Working with [name] - therapist. Stable interest and motivation. Taking medications as prescribed.  Energy levels stable. Active, does not have a regular exercise routine. Working with P/T. Walking more. Enjoys some usual interests and activities. Married. Lives with husband and son. Family local. Appetite adequate. Weight loss 158 from 165  pounds. Sleeps well most nights. Averages 8 hours.  Focus and concentration stable with medication. Completing tasks. Managing aspects of household. 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 Denies SI or HI.  Denies AH or VH. Denies self harm.  Previous medication trials: Effexor 37.5mg    Review of Systems:  Review of Systems  Musculoskeletal:  Negative for gait problem.  Neurological:  Negative for tremors.  Psychiatric/Behavioral:         Please refer to HPI    Medications: I have reviewed the patient's current medications.  Current Outpatient Medications  Medication Sig Dispense Refill   amphetamine-dextroamphetamine (ADDERALL XR) 25 MG 24 hr capsule Take 1 capsule by mouth daily. 30 capsule 0   amphetamine-dextroamphetamine (ADDERALL XR) 25 MG 24 hr capsule Take 1 capsule by mouth daily. 30 capsule 0   amphetamine-dextroamphetamine (ADDERALL XR) 25 MG 24 hr capsule Take 1 capsule by mouth daily. 30 capsule 0   cyclobenzaprine (FLEXERIL) 10 MG tablet Take 10 mg by mouth 3 (three) times daily as needed.     diclofenac (VOLTAREN) 75 MG EC tablet Take 1 tablet (75 mg total) by mouth 2 (two) times daily as needed for moderate pain. 30 tablet 0   gabapentin (NEURONTIN) 100 MG capsule Take 1 to 3 capsules by mouth at bedtime for back pain. 90 capsule 0   levonorgestrel (MIRENA) 20 MCG/24HR IUD by Intrauterine route.     methylPREDNISolone (MEDROL DOSEPAK) 4 MG TBPK tablet TAKE 6 TABLETS ON DAY 1 AS DIRECTED ON PACKAGE AND DECREASE BY 1 TAB EACH DAY FOR A TOTAL OF 6 DAYS  venlafaxine XR (EFFEXOR-XR) 37.5 MG 24 hr capsule TAKE 1 CAPSULE BY MOUTH DAILY WITH BREAKFAST for depression and anxiety. 90 capsule 3   No current facility-administered medications for this visit.    Medication Side Effects: None  Allergies: No Known Allergies  Past Medical History:  Diagnosis Date   Chronic back pain     Family History  Problem Relation Age of Onset   Cancer Maternal Aunt        breast    Hypertension Paternal Grandmother    Diabetes Paternal Grandfather     Social History   Socioeconomic History   Marital status: Married    Spouse name: Not on file   Number of children: Not on file   Years of education: Not on file   Highest education level: Not on file  Occupational History   Not on file  Tobacco Use   Smoking status: Never   Smokeless tobacco: Never  Substance and Sexual Activity   Alcohol use: Yes    Comment: occassionally   Drug use: No   Sexual activity: Not on file  Other Topics Concern   Not on file  Social History Narrative   Married.   1 child.   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   Social Determinants of Health   Financial Resource Strain: Not on file  Food Insecurity: Not on file  Transportation Needs: Not on file  Physical Activity: Not on file  Stress: Not on file  Social Connections: Not on file  Intimate Partner Violence: Not on file    Past Medical History, Surgical history, Social history, and Family history were reviewed and updated as appropriate.   Please see review of systems for further details on the patient's review from today.   Objective:   Physical Exam:  There were no vitals taken for this visit.  Physical Exam Constitutional:      General: She is not in acute distress. Musculoskeletal:        General: No deformity.  Neurological:     Mental Status: She is alert and oriented to person, place, and time.     Coordination: Coordination normal.  Psychiatric:        Attention and Perception: Attention and perception normal. She does not perceive auditory or visual hallucinations.        Mood and Affect: Mood normal. Mood is not anxious or depressed. Affect is not labile, blunt, angry or inappropriate.        Speech: Speech normal.        Behavior: Behavior normal.        Thought Content: Thought content normal. Thought content is not paranoid or delusional.  Thought content does not include homicidal or suicidal ideation. Thought content does not include homicidal or suicidal plan.        Cognition and Memory: Cognition and memory normal.        Judgment: Judgment normal.     Comments: Insight intact     Lab Review:     Component Value Date/Time   NA 140 07/04/2021 1605   K 4.0 07/04/2021 1605   CL 105 07/04/2021 1605   CO2 30 07/04/2021 1605   GLUCOSE 101 (H) 07/04/2021 1605   BUN 15 07/04/2021 1605   CREATININE 0.85 07/04/2021 1605   CALCIUM 10.0 07/04/2021 1605   PROT 6.9 07/04/2021 1605   ALBUMIN 4.3 09/30/2020 1542   AST 21 07/04/2021 1605  ALT 21 07/04/2021 1605   ALKPHOS 61 09/30/2020 1542   BILITOT 0.4 07/04/2021 1605       Component Value Date/Time   WBC 6.4 07/04/2021 1605   RBC 4.23 07/04/2021 1605   HGB 13.2 07/04/2021 1605   HCT 38.7 07/04/2021 1605   PLT 235 07/04/2021 1605   MCV 91.5 07/04/2021 1605   MCH 31.2 07/04/2021 1605   MCHC 34.1 07/04/2021 1605   RDW 12.3 07/04/2021 1605   LYMPHSABS 1.6 12/05/2012 0812   MONOABS 0.6 12/05/2012 0812   EOSABS 0.2 12/05/2012 0812   BASOSABS 0.0 12/05/2012 0812    No results found for: "POCLITH", "LITHIUM"   No results found for: "PHENYTOIN", "PHENOBARB", "VALPROATE", "CBMZ"   .res Assessment: Plan:    Plan:  PDMP reviewed  Adderall XR 25mg  every morning. Effexor 37.5mg  daily   RTC 3 months  Patient advised to contact office with any questions, adverse effects, or acute worsening in signs and symptoms.  Discussed potential benefits, risks, and side effects of stimulants with patient to include increased heart rate, palpitations, insomnia, increased anxiety, increased irritability, or decreased appetite. Instructed patient to contact office if experiencing any significant tolerability issues.  There are no diagnoses linked to this encounter.   Please see After Visit Summary for patient specific instructions.  No future appointments.  No orders of  the defined types were placed in this encounter.     -------------------------------

## 2022-05-25 ENCOUNTER — Emergency Department (HOSPITAL_BASED_OUTPATIENT_CLINIC_OR_DEPARTMENT_OTHER)
Admission: EM | Admit: 2022-05-25 | Discharge: 2022-05-26 | Disposition: A | Discharge status: Home / Self Care | Payer: BC Managed Care – PPO | Source: Home / Self Care | Attending: Emergency Medicine | Admitting: Emergency Medicine

## 2022-05-25 ENCOUNTER — Emergency Department (HOSPITAL_COMMUNITY)
Admission: EM | Admit: 2022-05-25 | Discharge: 2022-05-25 | Discharge status: Against Medical Advice | Payer: BC Managed Care – PPO | Attending: Emergency Medicine | Admitting: Emergency Medicine

## 2022-05-25 ENCOUNTER — Other Ambulatory Visit: Payer: Self-pay

## 2022-05-25 ENCOUNTER — Encounter (HOSPITAL_BASED_OUTPATIENT_CLINIC_OR_DEPARTMENT_OTHER): Payer: Self-pay

## 2022-05-25 DIAGNOSIS — M5442 Lumbago with sciatica, left side: Secondary | ICD-10-CM | POA: Insufficient documentation

## 2022-05-25 DIAGNOSIS — Z5321 Procedure and treatment not carried out due to patient leaving prior to being seen by health care provider: Secondary | ICD-10-CM | POA: Insufficient documentation

## 2022-05-25 DIAGNOSIS — M545 Low back pain, unspecified: Secondary | ICD-10-CM | POA: Diagnosis present

## 2022-05-25 DIAGNOSIS — M5432 Sciatica, left side: Secondary | ICD-10-CM

## 2022-05-25 LAB — CBC WITH DIFFERENTIAL/PLATELET
Abs Immature Granulocytes: 0.04 10*3/uL (ref 0.00–0.07)
Basophils Absolute: 0 10*3/uL (ref 0.0–0.1)
Basophils Relative: 0 %
Eosinophils Absolute: 0.1 10*3/uL (ref 0.0–0.5)
Eosinophils Relative: 1 %
HCT: 42.5 % (ref 36.0–46.0)
Hemoglobin: 14.6 g/dL (ref 12.0–15.0)
Immature Granulocytes: 0 %
Lymphocytes Relative: 14 %
Lymphs Abs: 1.4 10*3/uL (ref 0.7–4.0)
MCH: 31 pg (ref 26.0–34.0)
MCHC: 34.4 g/dL (ref 30.0–36.0)
MCV: 90.2 fL (ref 80.0–100.0)
Monocytes Absolute: 0.3 10*3/uL (ref 0.1–1.0)
Monocytes Relative: 3 %
Neutro Abs: 8 10*3/uL — ABNORMAL HIGH (ref 1.7–7.7)
Neutrophils Relative %: 82 %
Platelets: 246 10*3/uL (ref 150–400)
RBC: 4.71 MIL/uL (ref 3.87–5.11)
RDW: 12.1 % (ref 11.5–15.5)
WBC: 9.8 10*3/uL (ref 4.0–10.5)
nRBC: 0 % (ref 0.0–0.2)

## 2022-05-25 LAB — BASIC METABOLIC PANEL
Anion gap: 9 (ref 5–15)
BUN: 18 mg/dL (ref 6–20)
CO2: 24 mmol/L (ref 22–32)
Calcium: 9.7 mg/dL (ref 8.9–10.3)
Chloride: 107 mmol/L (ref 98–111)
Creatinine, Ser: 0.85 mg/dL (ref 0.44–1.00)
GFR, Estimated: >60 mL/min (ref >60)
Glucose, Bld: 97 mg/dL (ref 70–99)
Potassium: 3.8 mmol/L (ref 3.5–5.1)
Sodium: 140 mmol/L (ref 135–145)

## 2022-05-25 LAB — I-STAT BETA HCG BLOOD, ED (MC, WL, AP ONLY): I-stat hCG, quantitative: <5 m[IU]/mL (ref <5)

## 2022-05-25 MED ORDER — OXYCODONE-ACETAMINOPHEN 5-325 MG PO TABS
1.0000 | ORAL_TABLET | Freq: Once | ORAL | Status: AC
Start: 2022-05-25 — End: 2022-05-25
  Administered 2022-05-25: 1 via ORAL
  Filled 2022-05-25: qty 1

## 2022-05-25 NOTE — ED Triage Notes (Signed)
Sciatica type pain, seen at emergency ortho today.  prescribed prednisone  And pain has escalated Has been dealing with this issue x 3 weeks

## 2022-05-25 NOTE — ED Triage Notes (Signed)
Lower back pain. Lumbar surgery 2014- reoccurring pain has been manageable. This instance of pain has been worsening since Friday- worst episode of pain yet. Sharp pain in lumbar radiating down left leg- burning, numbness. No trauma, no loss of bowel,bladder.

## 2022-05-25 NOTE — ED Notes (Signed)
Patient educated about not driving or performing other critical tasks (such as operating heavy machinery, caring for infant/toddler/child) due to sedative nature of narcotic medications received while in the ED.  Pt/caregiver verbalized understanding.   

## 2022-05-25 NOTE — ED Provider Triage Note (Signed)
Emergency Medicine Provider Triage Evaluation Note  Natalie [name] , a 36 y.o. female  was evaluated in triage.  Pt complains of lumbar back pain.  Patient reports that pain has been ongoing intermittently for multiple months.  Pain became worse this weekend.  Pain is located to the left lumbar back and radiates into her left leg.  Patient endorses burning sensation to her leg and numbness to her left foot.  Patient denies any recent falls or traumatic injuries.   Review of Systems  Positive: Back pain, numbness, Negative: Saddle anesthesia, bowel/bladder dysfunction, IV drug use, fever, chills, dysuria, hematuria, urinary urgency, urinary frequency  Physical Exam  BP (!) 120/96 (BP Location: Left Arm)   Pulse (!) 114   Temp 97.8 F (36.6 C) (Oral)   Resp 16   Ht 5\' 4"  (1.626 m)   Wt 72.6 kg   SpO2 100%   BMI 27.46 kg/m  Gen:   Awake, no distress   Resp:  Normal effort  MSK:   Moves extremities without difficulty  Other:  No midline tenderness or deformity to cervical, thoracic, or lumbar spine.  Patient has diffuse tenderness to lumbar back.  +2 left DP pulse.  Patient able to move all extremities without difficulty  Medical Decision Making  Medically screening exam initiated at 6:45 PM.  Appropriate orders placed.  CHRISTYNE MCCAIN was informed that the remainder of the evaluation will be completed by another provider, this initial triage assessment does not replace that evaluation, and the importance of remaining in the ED until their evaluation is complete.     Arty Baumgartner, Haskel Schroeder 05/25/22 1847

## 2022-05-26 MED ORDER — KETOROLAC TROMETHAMINE 60 MG/2ML IM SOLN
30.0000 mg | Freq: Once | INTRAMUSCULAR | Status: AC
  Administered 2022-05-26: 30 mg via INTRAMUSCULAR
  Filled 2022-05-26: qty 2

## 2022-05-26 MED ORDER — HYDROMORPHONE HCL 1 MG/ML IJ SOLN
1.0000 mg | Freq: Once | INTRAMUSCULAR | Status: AC
  Administered 2022-05-26: 1 mg via INTRAMUSCULAR
  Filled 2022-05-26: qty 1

## 2022-05-26 NOTE — ED Provider Notes (Signed)
[facility] [place] EMERGENCY DEPT Provider Note  CSN: [id number] Arrival date & time: 05/25/22 2203  Chief Complaint(s) No chief complaint on file.  HPI Natalie [name] is a 36 y.o. female    The history is provided by the patient.  Back Pain Location:  Gluteal region Quality:  Aching, cramping and shooting Radiates to:  L foot and L thigh Pain severity:  Severe Onset quality:  Gradual Duration:  5 days Timing:  Constant Progression:  Waxing and waning Chronicity:  Recurrent Context comment:  H/o lumbar herniated disc s/p laminectomy in 2014 Relieved by: certain position and stretches. Worsened by:  Bending and movement Ineffective treatments:  NSAIDs Associated symptoms: leg pain and numbness   Associated symptoms: no bladder incontinence, no bowel incontinence, no dysuria and no perianal numbness     Past Medical History Past Medical History:  Diagnosis Date   Chronic back pain    Patient Active Problem List   Diagnosis Date Noted   Lower abdominal pain 07/04/2021   ADD (attention deficit disorder) 09/30/2020   History of surgery 12/25/2019   Anxiety and depression 05/10/2018   Preventative health care 02/11/2018   Hyperlipidemia 02/11/2018   Chronic back pain 11/04/2017   Obesity (BMI 30.0-34.9) 09/30/2016   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 Authorizing Provider  amphetamine-dextroamphetamine (ADDERALL XR) 25 MG 24 hr capsule Take 1 capsule by mouth daily. 04/27/22   Mozingo, Thereasa Solo, NP  amphetamine-dextroamphetamine (ADDERALL XR) 25 MG 24 hr capsule Take 1 capsule by mouth daily. 05/25/22   Mozingo, Thereasa Solo, NP  amphetamine-dextroamphetamine (ADDERALL XR) 25 MG 24 hr capsule Take 1 capsule by mouth daily. 06/22/22   Mozingo, Thereasa Solo, NP  cyclobenzaprine (FLEXERIL) 10 MG tablet Take 10  mg by mouth 3 (three) times daily as needed. 11/07/21   [provider]  diclofenac (VOLTAREN) 75 MG EC tablet Take 1 tablet (75 mg total) by mouth 2 (two) times daily as needed for moderate pain. 11/10/21   Doreene Nest, NP  gabapentin (NEURONTIN) 100 MG capsule Take 1 to 3 capsules by mouth at bedtime for back pain. 12/01/21   Doreene Nest, NP  levonorgestrel (MIRENA) 20 MCG/24HR IUD by Intrauterine route. 06/17/16   [provider]  methylPREDNISolone (MEDROL DOSEPAK) 4 MG TBPK tablet TAKE 6 TABLETS ON DAY 1 AS DIRECTED ON PACKAGE AND DECREASE BY 1 TAB EACH DAY FOR A TOTAL OF 6 DAYS 11/07/21   [provider]  venlafaxine XR (EFFEXOR-XR) 37.5 MG 24 hr capsule TAKE 1 CAPSULE BY MOUTH DAILY WITH BREAKFAST for depression and anxiety. 09/30/21   Mozingo, Thereasa Solo, NP  Allergies Patient has no known allergies.  Review of Systems Review of Systems  Gastrointestinal:  Negative for bowel incontinence.  Genitourinary:  Negative for bladder incontinence and dysuria.  Musculoskeletal:  Positive for back pain.  Neurological:  Positive for numbness.   As noted in HPI  Physical Exam Vital Signs  I have reviewed the triage vital signs BP (!) 112/90 (BP Location: Right Arm)   Pulse 92   Temp 98 F (36.7 C) (Tympanic)   Resp 18   Ht 5\' 4"  (1.626 m)   Wt 72.6 kg   SpO2 100%   BMI 27.46 kg/m   Physical Exam Vitals reviewed.  Constitutional:      General: She is not in acute distress.    Appearance: She is well-developed. She is not diaphoretic.  HENT:     Head: Normocephalic and atraumatic.     Right Ear: External ear normal.     Left Ear: External ear normal.     Nose: Nose normal.  Eyes:     General: No scleral icterus.    Conjunctiva/sclera: Conjunctivae normal.  Neck:     Trachea: Phonation normal.   Cardiovascular:     Rate and Rhythm: Normal rate and regular rhythm.  Pulmonary:     Effort: Pulmonary effort is normal. No respiratory distress.     Breath sounds: No stridor.  Abdominal:     General: There is no distension.  Musculoskeletal:        General: Normal range of motion.     Cervical back: Normal range of motion.     Lumbar back: Spasms and tenderness present. No bony tenderness.       Back:  Neurological:     Mental Status: She is alert and oriented to person, place, and time.  Psychiatric:        Behavior: Behavior normal.     ED Results and Treatments Labs (all labs ordered are listed, but only abnormal results are displayed) Labs Reviewed - No data to display                                                                                                                       EKG  EKG Interpretation  Date/Time:    Ventricular Rate:    PR Interval:    QRS Duration:   QT Interval:    QTC Calculation:   R Axis:     Text Interpretation:         Radiology No results found.  Medications Ordered in ED Medications  HYDROmorphone (DILAUDID) injection 1 mg (1 mg Intramuscular Given 05/26/22 0030)  ketorolac (TORADOL) injection 30 mg (30 mg Intramuscular Given 05/26/22 0030)  Procedures Procedures  (including critical care time)  Medical Decision Making / ED Course   Medical Decision Making Amount and/or Complexity of Data Reviewed External Data Reviewed: radiology.    Details: Lumbar MRI from 2013 and 2014 noting h/o herniated disc  Risk Prescription drug management. Parenteral controlled substances.    36 y.o. female presents with back pain in lumbar area for years, exacerbated in the last week with left sided radicular pain. No acute traumatic onset. No red flag symptoms of fever, weight loss, saddle anesthesia,  weakness, fecal/urinary incontinence or urinary retention.   Suspect herniated disc vs MSK etiology. No indication for imaging emergently.  Provided with IM meds.  Patient was recommended to take short course of scheduled NSAIDs and engage in early mobility as definitive treatment. She has already seen ortho UC and Rx'd Meloxicam, Flexeril, and prednisone taper. Return precautions discussed for worsening or new concerning symptoms.        Final Clinical Impression(s) / ED Diagnoses Final diagnoses:  Left sided sciatica   The patient appears reasonably screened and/or stabilized for discharge and I doubt any other medical condition or other [facility] requiring further screening, evaluation, or treatment in the ED at this time. I have discussed the findings, Dx and Tx plan with the patient/family who expressed understanding and agree(s) with the plan. Discharge instructions discussed at length. The patient/family was given strict return precautions who verbalized understanding of the instructions. No further questions at time of discharge.  Disposition: Discharge  Condition: Good  ED Discharge Orders     None       Follow Up: Doreene Nest, NP [address] [place] [address] [phone]  Call  to schedule an appointment for close follow up           This chart was dictated using voice recognition software.  Despite best efforts to proofread,  errors can occur which can change the documentation meaning.    Nira Conn, MD 05/26/22 [phone]

## 2022-05-26 NOTE — Discharge Instructions (Signed)
You may use over-the-counter Motrin (Ibuprofen), Acetaminophen (Tylenol), topical muscle creams such as SalonPas, [employer], Bengay, etc. Please stretch, apply ice or heat (whichever helps), and have massage therapy and/or use TENS unit for additional assistance.

## 2022-05-26 NOTE — ED Provider Notes (Incomplete)
[facility] EMERGENCY DEPT Provider Note  CSN: [id number] Arrival date & time: 05/25/22 2203  Chief Complaint(s) No chief complaint on file.  HPI Natalie [name] is a 36 y.o. female {Add pertinent medical, surgical, social history, OB history to HPI:1}   HPI  Past Medical History Past Medical History:  Diagnosis Date  . Chronic back pain    Patient Active Problem List   Diagnosis Date Noted  . Lower abdominal pain 07/04/2021  . ADD (attention deficit disorder) 09/30/2020  . History of surgery 12/25/2019  . Anxiety and depression 05/10/2018  . Preventative health care 02/11/2018  . Hyperlipidemia 02/11/2018  . Chronic back pain 11/04/2017  . Obesity (BMI 30.0-34.9) 09/30/2016  . Fetal anomaly 06/16/2016  . History of miscarriage 09/17/2014  . Miscarriage 04/30/2014   Home Medication(s) Prior to Admission medications   Medication Sig Start Date End Date Taking? Authorizing Provider  amphetamine-dextroamphetamine (ADDERALL XR) 25 MG 24 hr capsule Take 1 capsule by mouth daily. 04/27/22   Mozingo, Thereasa Solo, NP  amphetamine-dextroamphetamine (ADDERALL XR) 25 MG 24 hr capsule Take 1 capsule by mouth daily. 05/25/22   Mozingo, Thereasa Solo, NP  amphetamine-dextroamphetamine (ADDERALL XR) 25 MG 24 hr capsule Take 1 capsule by mouth daily. 06/22/22   Mozingo, Thereasa Solo, NP  cyclobenzaprine (FLEXERIL) 10 MG tablet Take 10 mg by mouth 3 (three) times daily as needed. 11/07/21   [provider]  diclofenac (VOLTAREN) 75 MG EC tablet Take 1 tablet (75 mg total) by mouth 2 (two) times daily as needed for moderate pain. 11/10/21   Doreene Nest, NP  gabapentin (NEURONTIN) 100 MG capsule Take 1 to 3 capsules by mouth at bedtime for back pain. 12/01/21   Doreene Nest, NP  levonorgestrel (MIRENA) 20 MCG/24HR IUD by Intrauterine route. 06/17/16   [provider]  methylPREDNISolone (MEDROL DOSEPAK) 4 MG TBPK tablet TAKE 6 TABLETS ON DAY 1 AS  DIRECTED ON PACKAGE AND DECREASE BY 1 TAB EACH DAY FOR A TOTAL OF 6 DAYS 11/07/21   [provider]  venlafaxine XR (EFFEXOR-XR) 37.5 MG 24 hr capsule TAKE 1 CAPSULE BY MOUTH DAILY WITH BREAKFAST for depression and anxiety. 09/30/21   Mozingo, Thereasa Solo, NP                                                                                                                                    Allergies Patient has no known allergies.  Review of Systems Review of Systems As noted in HPI  Physical Exam Vital Signs  I have reviewed the triage vital signs BP (!) 112/90 (BP Location: Right Arm)   Pulse 92   Temp 98 F (36.7 C) (Tympanic)   Resp 18   Ht 5\' 4"  (1.626 m)   Wt 72.6 kg   SpO2 100%   BMI 27.46 kg/m  *** Physical Exam  ED Results and Treatments Labs (all labs ordered are listed,  but only abnormal results are displayed) Labs Reviewed - No data to display                                                                                                                       EKG  EKG Interpretation  Date/Time:    Ventricular Rate:    PR Interval:    QRS Duration:   QT Interval:    QTC Calculation:   R Axis:     Text Interpretation:         Radiology No results found.  Medications Ordered in ED Medications - No data to display                                                                                                                                   Procedures Procedures  (including critical care time)  Medical Decision Making / ED Course   Medical Decision Making         Final Clinical Impression(s) / ED Diagnoses Final diagnoses:  None    {Document critical care time when appropriate:1}  {Document review of labs and clinical decision tools ie heart score, Chads2Vasc2 etc:1}  {Document your independent review of radiology images, and any outside records:1} {Document your discussion with family members, caretakers, and with  consultants:1} {Document social determinants of health affecting pt's care:1} {Document your decision making why or why not admission, treatments were needed:1} This chart was dictated using voice recognition software.  Despite best efforts to proofread,  errors can occur which can change the documentation meaning.

## 2022-05-28 ENCOUNTER — Ambulatory Visit (INDEPENDENT_AMBULATORY_CARE_PROVIDER_SITE_OTHER): Payer: BC Managed Care – PPO | Admitting: Primary Care

## 2022-05-28 ENCOUNTER — Encounter: Payer: Self-pay | Admitting: Primary Care

## 2022-05-28 VITALS — BP 118/62 | HR 110 | Temp 98.6°F | Ht 64.0 in | Wt 155.0 lb

## 2022-05-28 DIAGNOSIS — M5442 Lumbago with sciatica, left side: Secondary | ICD-10-CM | POA: Diagnosis not present

## 2022-05-28 DIAGNOSIS — G8929 Other chronic pain: Secondary | ICD-10-CM

## 2022-05-28 MED ORDER — KETOROLAC TROMETHAMINE 60 MG/2ML IM SOLN
60.0000 mg | Freq: Once | INTRAMUSCULAR | Status: AC
  Administered 2022-05-28: 60 mg via INTRAMUSCULAR

## 2022-05-28 NOTE — Patient Instructions (Signed)
Call your insurance company and [facility] regarding the MRI.  Try the exercises below.  You will be contacted regarding your referral to orthopedics.  Please let us know if you have not been contacted within two weeks.   It was a pleasure to see you today!  Piriformis Syndrome  Piriformis syndrome is a condition that can cause pain and numbness in your buttocks and down the back of your leg. Piriformis syndrome happens when the small muscle that connects the base of your spine to your hip (piriformis muscle) presses on the nerve that runs down the back of your leg (sciatic nerve). The piriformis muscle helps your hip rotate and helps to bring your leg back and out. It also helps shift your weight to keep you stable while you are walking. The sciatic nerve runs under or through the piriformis muscle. Damage to the piriformis muscle can cause spasms that put pressure on the nerve below. This causes pain and discomfort while sitting and moving. The pain may feel as if it begins in the buttock and spreads (radiates) down your hip and thigh. What are the causes? This condition is caused by pressure on the sciatic nerve from the piriformis muscle. The piriformis muscle can get irritated with overuse, especially if other hip muscles are weak and the piriformis muscle has to do extra work. Piriformis syndrome can also occur after an injury, like a fall onto your buttocks. What increases the risk? You are more likely to develop this condition if you: Are a woman. Sit for long periods of time. Are a cyclist. Have weak buttocks muscles (gluteal muscles). What are the signs or symptoms? Symptoms of this condition include: Pain, tingling, or numbness that starts in the buttock and runs down the back of your leg (sciatica). Pain in the groin or thigh area. Your symptoms may get worse: The longer you sit. When you walk, run, or climb stairs. When straining to have a bowel movement. How is this  diagnosed? This condition is diagnosed based on your symptoms, medical history, and physical exam. During the exam, your health care provider may: Move your leg into different positions to check for pain. Press on the muscles of your hip and buttock to see if that increases your symptoms. You may also have tests, including: Imaging tests such as X-rays, CT, MRI, or ultrasound. Electromyogram (EMG). This test measures electrical signals sent by your nerves into the muscles. Nerve conduction study. This test measures how well electrical signals pass through your nerves. How is this treated? This condition may be treated by: Stopping all activities that cause pain or make your condition worse. Applying ice or using heat therapy. Taking medicines to reduce pain and swelling. Taking a muscle relaxer (muscle relaxant) to stop muscle spasms. Doing range-of-motion and strengthening exercises (physical therapy) as told by your health care provider. Having massage, acupuncture, or local electrical stimulation (transcutaneous electrical nerve stimulation, TENS). Getting an injection of medicine in the piriformis muscle. Your health care provider will choose the medicine based on your condition. He or she may inject: An anti-inflammatory medicine (steroid) to reduce swelling. A numbing medicine (local anesthetic) to block the pain. Botulinum toxin. The toxin blocks nerve impulses to specific muscles to reduce muscle tension. In rare cases, you may need surgery to cut the muscle and release pressure on the nerve if other treatments do not work. Follow these instructions at home: Activity Do not sit for long periods. Get up and walk around every 20  minutes or as often as told by your health care provider. When driving long distances, make sure to take frequent stops to get up and stretch. Use a cushion when you sit on hard surfaces. Do exercises as told by your health care provider. Return to your  normal activities as told by your health care provider. Ask your health care provider what activities are safe for you. Managing pain, stiffness, and swelling     If directed, apply heat to the area as often as told by your health care provider. Use the heat source that your health care provider recommends, such as a moist heat pack or a heating pad. Place a towel between your skin and the heat source. Leave the heat on for 20-30 minutes. Remove the heat if your skin turns bright red. This is especially important if you are unable to feel pain, heat, or cold. You have a greater risk of getting burned. If directed, put ice on the injured area. To do this: Put ice in a plastic bag. Place a towel between your skin and the bag. Leave the ice on for 20 minutes, 2-3 times a day. Remove the ice if your skin turns bright red. This is very important. If you cannot feel pain, heat, or cold, you have a greater risk of damage to the area. General instructions Take over-the-counter and prescription medicines only as told by your health care provider. Ask your health care provider if the medicine prescribed to you requires you to avoid driving or using machinery. You may need to take these actions to prevent or treat constipation: Drink enough fluid to keep your urine pale yellow. Take over-the-counter or prescription medicines. Eat foods that are high in fiber, such as beans, whole grains, and fresh fruits and vegetables. Limit foods that are high in fat and processed sugars, such as fried or sweet foods. Keep all follow-up visits. This is important. How is this prevented? Do not sit for longer than 20 minutes at a time. When you sit, choose padded surfaces. Warm up and stretch before being active. Cool down and stretch after being active. Contact a health care provider if: Your pain and stiffness continue or get worse. Your leg or hip becomes weak. You have changes in your bowel function or bladder  function. Summary Piriformis syndrome is a condition that can cause pain, tingling, and numbness in your buttocks and down the back of your leg. You may try applying heat or ice to relieve the pain. Do not sit for long periods. Get up and walk around every 20 minutes or as often as told by your health care provider. This information is not intended to replace advice given to you by your health care provider. Make sure you discuss any questions you have with your health care provider. Document Revised: 03/24/2021 Document Reviewed: 03/24/2021 Elsevier Patient Education  2023 [employer].

## 2022-05-28 NOTE — Progress Notes (Signed)
Subjective:    Patient ID: Natalie [name], female    DOB: 1986-07-19, 36 y.o.   MRN: [id number]  Back Pain Associated symptoms include numbness. Pertinent negatives include no weakness.    Natalie [name] is a very pleasant 36 y.o. female with a history of chronic back pain, history of lumbar herniated disc with laminectomy in 2014 who presents today for ED follow-up and to discuss gluteal/back pain.  She presented to [facility] walk in clinic on 05/25/22 with a five day history of left gluteal pain, radiation down to left lower extremity through her foot with numbness. She was prescribed a steroid pack, Meloxicam 15 mg. She was told that she would be contacted regarding a MRI.   She then presented to [facility] due to excruciating left gluteal and lower extremity pain. She was triaged, provided with one percocet tablet, and then left without being seen. Was told that [facility] has a MRI machine and was less busy.   She then presented to [facility] on 05/25/2022 for persistent left gluteal/lower back pain with radiation to the left thigh and foot, numbness. She had no bowel/bladder incontinence.  During her stay in the ED she was provided with an injection of Toradol and Dilaudid.  Symptoms improved so she was discharged home with recommendations for PCP/orthopedic follow-up.  Today she's feeling better. She's unsure what may have sparked her symptoms, she did step into a large hole 1-2 weeks ago without a fall. She's been walking a lot this summer. She completed physical therapy in early 2023, felt improved. Does not complete home PT exercises. She has yet to hear from [facility] regarding her MRI. She denies bowel/bladder incontinence, groin numbness.   Review of Systems  Genitourinary:        Denies bowel/bladder incontinence   Musculoskeletal:  Positive for back pain and myalgias.  Neurological:  Positive for numbness. Negative for weakness.         Past Medical History:   Diagnosis Date   Chronic back pain     Social History   Socioeconomic History   Marital status: Married    Spouse name: Not on file   Number of children: Not on file   Years of education: Not on file   Highest education level: Not on file  Occupational History   Not on file  Tobacco Use   Smoking status: Never   Smokeless tobacco: Never  Substance and Sexual Activity   Alcohol use: Yes    Comment: occassionally   Drug use: No   Sexual activity: Not on file  Other Topics Concern   Not on file  Social History Narrative   Married.   1 child.   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   Social Determinants of Health   Financial Resource Strain: Not on file  Food Insecurity: Not on file  Transportation Needs: Not on file  Physical Activity: Not on file  Stress: Not on file  Social Connections: Not on file  Intimate Partner Violence: Not on file    Past Surgical History:  Procedure Laterality Date   BACK SURGERY     DILATION AND EVACUATION N/A 04/30/2014   Procedure: DILATATION AND EVACUATION;  Surgeon: Philip Aspen, DO;  Location: [facility] ORS;  Service: Gynecology;  Laterality: N/A;   DILATION AND EVACUATION N/A 10/24/2014   Procedure: DILATATION AND EVACUATION with genetic studies;  Surgeon: Mitchel Honour, DO;  Location: [facility]  ORS;  Service: Gynecology;  Laterality: N/A;  genetic studies    Family History  Problem Relation Age of Onset   Cancer Maternal Aunt        breast   Hypertension Paternal Grandmother    Diabetes Paternal Grandfather     No Known Allergies  Current Outpatient Medications on File Prior to Visit  Medication Sig Dispense Refill   amphetamine-dextroamphetamine (ADDERALL XR) 25 MG 24 hr capsule Take 1 capsule by mouth daily. 30 capsule 0   amphetamine-dextroamphetamine (ADDERALL XR) 25 MG 24 hr capsule Take 1 capsule by mouth daily. 30 capsule 0   [START ON 06/22/2022] amphetamine-dextroamphetamine  (ADDERALL XR) 25 MG 24 hr capsule Take 1 capsule by mouth daily. 30 capsule 0   gabapentin (NEURONTIN) 100 MG capsule Take 1 to 3 capsules by mouth at bedtime for back pain. 90 capsule 0   levonorgestrel (MIRENA) 20 MCG/24HR IUD by Intrauterine route.     methylPREDNISolone (MEDROL DOSEPAK) 4 MG TBPK tablet TAKE 6 TABLETS ON DAY 1 AS DIRECTED ON PACKAGE AND DECREASE BY 1 TAB EACH DAY FOR A TOTAL OF 6 DAYS     venlafaxine XR (EFFEXOR-XR) 37.5 MG 24 hr capsule TAKE 1 CAPSULE BY MOUTH DAILY WITH BREAKFAST for depression and anxiety. 90 capsule 3   cyclobenzaprine (FLEXERIL) 10 MG tablet Take 10 mg by mouth 3 (three) times daily as needed. (Patient not taking: Reported on 05/28/2022)     diclofenac (VOLTAREN) 75 MG EC tablet Take 1 tablet (75 mg total) by mouth 2 (two) times daily as needed for moderate pain. (Patient not taking: Reported on 05/28/2022) 30 tablet 0   meloxicam (MOBIC) 15 MG tablet Take 15 mg by mouth daily.     No current facility-administered medications on file prior to visit.    BP 118/62   Pulse (!) 110   Temp 98.6 F (37 C) (Oral)   Ht 5\' 4"  (1.626 m)   Wt 155 lb (70.3 kg)   SpO2 99%   BMI 26.61 kg/m  Objective:   Physical Exam Constitutional:      General: She is not in acute distress. Cardiovascular:     Rate and Rhythm: Normal rate and regular rhythm.  Pulmonary:     Effort: Pulmonary effort is normal.     Breath sounds: Normal breath sounds.  Musculoskeletal:       Legs:     Comments: Pain to left gluteal region with left lower extremity leg raise. 5/5 strength bilaterally.   Neurological:     Mental Status: She is alert.           Assessment & Plan:   Problem List Items Addressed This Visit       Other   Chronic back pain - Primary    Symptoms and exam today representative of piriformis syndrome. Home PT exercises provided. IM Toradol 60 mg provided. Continue oral steroids.  Referral placed to orthopedics to establish. She will update  regarding the MRI.  Return precautions provided.       Relevant Medications   meloxicam (MOBIC) 15 MG tablet   Other Relevant Orders   AMB referral to orthopedics       , NP

## 2022-05-28 NOTE — Assessment & Plan Note (Addendum)
Symptoms and exam today representative of piriformis syndrome. Home PT exercises provided. IM Toradol 60 mg provided. Continue oral steroids.  Referral placed to orthopedics to establish. She will update regarding the MRI.  Return precautions provided.  ED notes, labs reviewed.

## 2022-05-29 ENCOUNTER — Ambulatory Visit: Payer: BC Managed Care – PPO | Admitting: Primary Care

## 2022-06-16 ENCOUNTER — Telehealth: Payer: Self-pay | Admitting: Adult Health

## 2022-06-16 ENCOUNTER — Other Ambulatory Visit: Payer: Self-pay

## 2022-06-16 NOTE — Telephone Encounter (Signed)
Pt has rx on file  

## 2022-06-16 NOTE — Telephone Encounter (Signed)
Patient called in for refill on Adderall 25mg . States that her pharmacy has in stock. Ph: [phone] Pharmacy [facility] [address] [place]

## 2022-08-20 ENCOUNTER — Other Ambulatory Visit: Payer: Self-pay

## 2022-08-20 ENCOUNTER — Telehealth: Payer: Self-pay | Admitting: Adult Health

## 2022-08-20 DIAGNOSIS — F909 Attention-deficit hyperactivity disorder, unspecified type: Secondary | ICD-10-CM

## 2022-08-20 MED ORDER — AMPHETAMINE-DEXTROAMPHET ER 25 MG PO CP24: 25.0000 mg | ORAL_CAPSULE | Freq: Every day | ORAL | 0 refills | Status: DC

## 2022-08-20 NOTE — Telephone Encounter (Signed)
Pt requesting Rx generic adderall XR 25 mg to [facility] [address]

## 2022-08-20 NOTE — Telephone Encounter (Signed)
Pended.

## 2022-08-24 ENCOUNTER — Telehealth: Payer: BC Managed Care – PPO | Admitting: Adult Health

## 2022-08-24 ENCOUNTER — Encounter: Payer: Self-pay | Admitting: Adult Health

## 2022-08-24 DIAGNOSIS — F331 Major depressive disorder, recurrent, moderate: Secondary | ICD-10-CM

## 2022-08-24 DIAGNOSIS — F411 Generalized anxiety disorder: Secondary | ICD-10-CM | POA: Diagnosis not present

## 2022-08-24 DIAGNOSIS — F422 Mixed obsessional thoughts and acts: Secondary | ICD-10-CM

## 2022-08-24 DIAGNOSIS — F909 Attention-deficit hyperactivity disorder, unspecified type: Secondary | ICD-10-CM

## 2022-08-24 MED ORDER — AMPHETAMINE-DEXTROAMPHET ER 25 MG PO CP24: 25.0000 mg | ORAL_CAPSULE | Freq: Every day | ORAL | 0 refills | Status: DC

## 2022-08-24 MED ORDER — VENLAFAXINE HCL ER 37.5 MG PO CP24: ORAL_CAPSULE | ORAL | 3 refills | Status: DC

## 2022-08-24 NOTE — Progress Notes (Signed)
Natalie [name] [id number] 10/06/1986 36 y.o.  Virtual Visit via Video Note  I connected with pt @ on 08/24/22 at  4:40 PM EST by a video enabled telemedicine application and verified that I am speaking with the correct person using two identifiers.   I discussed the limitations of evaluation and management by telemedicine and the availability of in person appointments. The patient expressed understanding and agreed to proceed.  I discussed the assessment and treatment plan with the patient. The patient was provided an opportunity to ask questions and all were answered. The patient agreed with the plan and demonstrated an understanding of the instructions.   The patient was advised to call back or seek an in-person evaluation if the symptoms worsen or if the condition fails to improve as anticipated.  I provided 25 minutes of non-face-to-face time during this encounter.  The patient was located at home.  The provider was located at [facility].   Dorothyann Gibbs, NP   Subjective:   [name] is a 36 y.o. (DOB June 21, 1986) female.  Chief Complaint: No chief complaint on file.   HPI Natalie [name] presents for follow-up of MDD, GAD, obsessional acts and ADHD.  Describes mood today as "ok". Pleasant. Reports "some" tearfulness. Mood symptoms - reports some depression, anxiety and irritability - "more situational". Reports two panic attacks since last visit. Mood is variable. Stating "it's been a rough few months - grandmother passed away". Reports increased skin picking - trying to use different methods to deter it. She and family doing well. Working with [name] - therapist. Stable interest and motivation. Taking medications as prescribed. Energy levels stable. Active, has a regular exercise routine. Walking 2 miles a day. Enjoys some usual interests and activities. Married. Lives with husband and son - (6th grader). Family local. Appetite adequate. Weight stable  - 160 pounds. Sleeps well most nights. Averages 8 hours.  Focus and concentration stable with medication. Completing tasks. Managing aspects of household. 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 Denies SI or HI.  Denies AH or VH. Denies self harm. Denies substance use.  Previous medication trials: Effexor 37.5mg   Review of Systems:  Review of Systems  Musculoskeletal:  Negative for gait problem.  Neurological:  Negative for tremors.  Psychiatric/Behavioral:         Please refer to HPI    Medications: I have reviewed the patient's current medications.  Current Outpatient Medications  Medication Sig Dispense Refill   amphetamine-dextroamphetamine (ADDERALL XR) 25 MG 24 hr capsule Take 1 capsule by mouth daily. 30 capsule 0   [START ON 09/21/2022] amphetamine-dextroamphetamine (ADDERALL XR) 25 MG 24 hr capsule Take 1 capsule by mouth daily. 30 capsule 0   [START ON 10/19/2022] amphetamine-dextroamphetamine (ADDERALL XR) 25 MG 24 hr capsule Take 1 capsule by mouth daily. 30 capsule 0   cyclobenzaprine (FLEXERIL) 10 MG tablet Take 10 mg by mouth 3 (three) times daily as needed. (Patient not taking: Reported on 05/28/2022)     diclofenac (VOLTAREN) 75 MG EC tablet Take 1 tablet (75 mg total) by mouth 2 (two) times daily as needed for moderate pain. (Patient not taking: Reported on 05/28/2022) 30 tablet 0   gabapentin (NEURONTIN) 100 MG capsule Take 1 to 3 capsules by mouth at bedtime for back pain. 90 capsule 0   levonorgestrel (MIRENA) 20 MCG/24HR IUD by Intrauterine route.     meloxicam (MOBIC) 15 MG tablet Take 15 mg by mouth daily.  methylPREDNISolone (MEDROL DOSEPAK) 4 MG TBPK tablet TAKE 6 TABLETS ON DAY 1 AS DIRECTED ON PACKAGE AND DECREASE BY 1 TAB EACH DAY FOR A TOTAL OF 6 DAYS     venlafaxine XR (EFFEXOR-XR) 37.5 MG 24 hr capsule TAKE 1 CAPSULE BY MOUTH DAILY WITH BREAKFAST for depression and anxiety. 90 capsule 3   No current facility-administered medications for this visit.     Medication Side Effects: None  Allergies: No Known Allergies  Past Medical History:  Diagnosis Date   Chronic back pain     Family History  Problem Relation Age of Onset   Cancer Maternal Aunt        breast   Hypertension Paternal Grandmother    Diabetes Paternal Grandfather     Social History   Socioeconomic History   Marital status: Married    Spouse name: Not on file   Number of children: Not on file   Years of education: Not on file   Highest education level: Not on file  Occupational History   Not on file  Tobacco Use   Smoking status: Never   Smokeless tobacco: Never  Substance and Sexual Activity   Alcohol use: Yes    Comment: occassionally   Drug use: No   Sexual activity: Not on file  Other Topics Concern   Not on file  Social History Narrative   Married.   1 child.   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   Social Determinants of Health   Financial Resource Strain: Not on file  Food Insecurity: Not on file  Transportation Needs: Not on file  Physical Activity: Not on file  Stress: Not on file  Social Connections: Not on file  Intimate Partner Violence: Not on file    Past Medical History, Surgical history, Social history, and Family history were reviewed and updated as appropriate.   Please see review of systems for further details on the patient's review from today.   Objective:   Physical Exam:  There were no vitals taken for this visit.  Physical Exam Constitutional:      General: She is not in acute distress. Musculoskeletal:        General: No deformity.  Neurological:     Mental Status: She is alert and oriented to person, place, and time.     Coordination: Coordination normal.  Psychiatric:        Attention and Perception: Attention and perception normal. She does not perceive auditory or visual hallucinations.        Mood and Affect: Mood normal. Mood is not anxious or  depressed. Affect is not labile, blunt, angry or inappropriate.        Speech: Speech normal.        Behavior: Behavior normal.        Thought Content: Thought content normal. Thought content is not paranoid or delusional. Thought content does not include homicidal or suicidal ideation. Thought content does not include homicidal or suicidal plan.        Cognition and Memory: Cognition and memory normal.        Judgment: Judgment normal.     Comments: Insight intact     Lab Review:     Component Value Date/Time   NA 140 05/25/2022 1848   K 3.8 05/25/2022 1848   CL 107 05/25/2022 1848   CO2 24 05/25/2022 1848   GLUCOSE 97 05/25/2022 1848   BUN 18 05/25/2022  1848   CREATININE 0.85 05/25/2022 1848   CREATININE 0.85 07/04/2021 1605   CALCIUM 9.7 05/25/2022 1848   PROT 6.9 07/04/2021 1605   ALBUMIN 4.3 09/30/2020 1542   AST 21 07/04/2021 1605   ALT 21 07/04/2021 1605   ALKPHOS 61 09/30/2020 1542   BILITOT 0.4 07/04/2021 1605   GFRNONAA >60 05/25/2022 1848       Component Value Date/Time   WBC 9.8 05/25/2022 1848   RBC 4.71 05/25/2022 1848   HGB 14.6 05/25/2022 1848   HCT 42.5 05/25/2022 1848   PLT 246 05/25/2022 1848   MCV 90.2 05/25/2022 1848   MCH 31.0 05/25/2022 1848   MCHC 34.4 05/25/2022 1848   RDW 12.1 05/25/2022 1848   LYMPHSABS 1.4 05/25/2022 1848   MONOABS 0.3 05/25/2022 1848   EOSABS 0.1 05/25/2022 1848   BASOSABS 0.0 05/25/2022 1848    No results found for: "POCLITH", "LITHIUM"   No results found for: "PHENYTOIN", "PHENOBARB", "VALPROATE", "CBMZ"   .res Assessment: Plan:    Plan:  PDMP reviewed  Adderall XR 25mg  every morning. Effexor 37.5mg  daily   RTC 3 months  Patient advised to contact office with any questions, adverse effects, or acute worsening in signs and symptoms.  Discussed potential benefits, risks, and side effects of stimulants with patient to include increased heart rate, palpitations, insomnia, increased anxiety, increased  irritability, or decreased appetite. Instructed patient to contact office if experiencing any significant tolerability issues. Diagnoses and all orders for this visit:  Major depressive disorder, recurrent episode, moderate (HCC) -     venlafaxine XR (EFFEXOR-XR) 37.5 MG 24 hr capsule; TAKE 1 CAPSULE BY MOUTH DAILY WITH BREAKFAST for depression and anxiety.  Generalized anxiety disorder -     venlafaxine XR (EFFEXOR-XR) 37.5 MG 24 hr capsule; TAKE 1 CAPSULE BY MOUTH DAILY WITH BREAKFAST for depression and anxiety.  Attention deficit hyperactivity disorder (ADHD), unspecified ADHD type -     amphetamine-dextroamphetamine (ADDERALL XR) 25 MG 24 hr capsule; Take 1 capsule by mouth daily. -     amphetamine-dextroamphetamine (ADDERALL XR) 25 MG 24 hr capsule; Take 1 capsule by mouth daily. -     amphetamine-dextroamphetamine (ADDERALL XR) 25 MG 24 hr capsule; Take 1 capsule by mouth daily.  Mixed obsessional thoughts and acts -     venlafaxine XR (EFFEXOR-XR) 37.5 MG 24 hr capsule; TAKE 1 CAPSULE BY MOUTH DAILY WITH BREAKFAST for depression and anxiety.     Please see After Visit Summary for patient specific instructions.  No future appointments.   No orders of the defined types were placed in this encounter.     -------------------------------

## 2022-11-30 ENCOUNTER — Encounter: Payer: Self-pay | Admitting: Adult Health

## 2022-11-30 ENCOUNTER — Telehealth (INDEPENDENT_AMBULATORY_CARE_PROVIDER_SITE_OTHER): Payer: BC Managed Care – PPO | Admitting: Adult Health

## 2022-11-30 DIAGNOSIS — F331 Major depressive disorder, recurrent, moderate: Secondary | ICD-10-CM | POA: Diagnosis not present

## 2022-11-30 DIAGNOSIS — F411 Generalized anxiety disorder: Secondary | ICD-10-CM

## 2022-11-30 DIAGNOSIS — F909 Attention-deficit hyperactivity disorder, unspecified type: Secondary | ICD-10-CM | POA: Diagnosis not present

## 2022-11-30 DIAGNOSIS — F422 Mixed obsessional thoughts and acts: Secondary | ICD-10-CM

## 2022-11-30 MED ORDER — AMPHETAMINE-DEXTROAMPHET ER 25 MG PO CP24: 25.0000 mg | ORAL_CAPSULE | Freq: Every day | ORAL | 0 refills | Status: DC

## 2022-11-30 NOTE — Progress Notes (Signed)
Natalie [name] [id number] [birth year] 37 y.o.  Virtual Visit via Video Note  I connected with pt @ on 11/30/22 at  3:00 PM EST by a video enabled telemedicine application and verified that I am speaking with the correct person using two identifiers.   I discussed the limitations of evaluation and management by telemedicine and the availability of in person appointments. The patient expressed understanding and agreed to proceed.  I discussed the assessment and treatment plan with the patient. The patient was provided an opportunity to ask questions and all were answered. The patient agreed with the plan and demonstrated an understanding of the instructions.   The patient was advised to call back or seek an in-person evaluation if the symptoms worsen or if the condition fails to improve as anticipated.  I provided 25 minutes of non-face-to-face time during this encounter.  The patient was located at home.  The provider was located at [place].   Natalie Gell, NP   Subjective:   [name] is a 37 y.o. (DOB [birth year]) female.  Chief Complaint: No chief complaint on file.   HPI Natalie [name] presents for follow-up of  MDD, GAD, obsessional acts and ADHD.  Describes mood today as "ok". Pleasant. Reports "some" tearfulness. Mood symptoms - reports some depression, anxiety and irritability - "feel more fatigued - low iron and Vitamin D". Denies panic attacks since last visit. Mood is variable. Stating "I've been feeling kinda blah". Feels like medications are helpful. Had to put her cat of 17 years  to sleep last week. Reports decreased skin picking. She and family doing well. Working with Natalie [name] - therapist. Stable interest and motivation. Taking medications as prescribed. Energy levels stable. Active, has a regular exercise routine. Walking. Enjoys some usual interests and activities. Married. Lives with husband and son - (6th grader). Family  local. Appetite adequate. Weight stable - 160 pounds. Sleep is variable. Trying to keep a consistent sleep schedule. Averages 8 hours.  Focus and concentration stable with Adderall. Completing tasks. Managing aspects of household. Working full time as a Educational psychologist - pre-K. Denies SI or HI.  Denies AH or VH. Denies self harm. Denies substance use.  Previous medication trials: Effexor 37.67m  Review of Systems:  Review of Systems  Musculoskeletal:  Negative for gait problem.  Neurological:  Negative for tremors.  Psychiatric/Behavioral:         Please refer to HPI    Medications: I have reviewed the patient's current medications.  Current Outpatient Medications  Medication Sig Dispense Refill   amphetamine-dextroamphetamine (ADDERALL XR) 25 MG 24 hr capsule Take 1 capsule by mouth daily. 30 capsule 0   [START ON 12/28/2022] amphetamine-dextroamphetamine (ADDERALL XR) 25 MG 24 hr capsule Take 1 capsule by mouth daily. 30 capsule 0   [START ON 01/25/2023] amphetamine-dextroamphetamine (ADDERALL XR) 25 MG 24 hr capsule Take 1 capsule by mouth daily. 30 capsule 0   cyclobenzaprine (FLEXERIL) 10 MG tablet Take 10 mg by mouth 3 (three) times daily as needed. (Patient not taking: Reported on 05/28/2022)     diclofenac (VOLTAREN) 75 MG EC tablet Take 1 tablet (75 mg total) by mouth 2 (two) times daily as needed for moderate pain. (Patient not taking: Reported on 05/28/2022) 30 tablet 0   gabapentin (NEURONTIN) 100 MG capsule Take 1 to 3 capsules by mouth at bedtime for back pain. 90 capsule 0   levonorgestrel (MIRENA) 20 MCG/24HR IUD by Intrauterine route.  meloxicam (MOBIC) 15 MG tablet Take 15 mg by mouth daily.     methylPREDNISolone (MEDROL DOSEPAK) 4 MG TBPK tablet TAKE 6 TABLETS ON DAY 1 AS DIRECTED ON PACKAGE AND DECREASE BY 1 TAB EACH DAY FOR A TOTAL OF 6 DAYS     venlafaxine XR (EFFEXOR-XR) 37.5 MG 24 hr capsule TAKE 1 CAPSULE BY MOUTH DAILY WITH BREAKFAST for depression and anxiety. 90 capsule  3   No current facility-administered medications for this visit.    Medication Side Effects: None  Allergies: No Known Allergies  Past Medical History:  Diagnosis Date   Chronic back pain     Family History  Problem Relation Age of Onset   Cancer Maternal Aunt        breast   Hypertension Paternal Grandmother    Diabetes Paternal Grandfather     Social History   Socioeconomic History   Marital status: Married    Spouse name: Not on file   Number of children: Not on file   Years of education: Not on file   Highest education level: Not on file  Occupational History   Not on file  Tobacco Use   Smoking status: Never   Smokeless tobacco: Never  Substance and Sexual Activity   Alcohol use: Yes    Comment: occassionally   Drug use: No   Sexual activity: Not on file  Other Topics Concern   Not on file  Social History Narrative   Married.   1 child.   Works as an Surveyor, quantity at a childcare center.   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   Social Determinants of Health   Financial Resource Strain: Not on file  Food Insecurity: Not on file  Transportation Needs: Not on file  Physical Activity: Not on file  Stress: Not on file  Social Connections: Not on file  Intimate Partner Violence: Not on file    Past Medical History, Surgical history, Social history, and Family history were reviewed and updated as appropriate.   Please see review of systems for further details on the patient's review from today.   Objective:   Physical Exam:  There were no vitals taken for this visit.  Physical Exam Constitutional:      General: She is not in acute distress. Musculoskeletal:        General: No deformity.  Neurological:     Mental Status: She is alert and oriented to person, place, and time.     Coordination: Coordination normal.  Psychiatric:        Attention and Perception: Attention and perception normal. She does not perceive auditory or visual  hallucinations.        Mood and Affect: Mood normal. Mood is not anxious or depressed. Affect is not labile, blunt, angry or inappropriate.        Speech: Speech normal.        Behavior: Behavior normal.        Thought Content: Thought content normal. Thought content is not paranoid or delusional. Thought content does not include homicidal or suicidal ideation. Thought content does not include homicidal or suicidal plan.        Cognition and Memory: Cognition and memory normal.        Judgment: Judgment normal.     Comments: Insight intact     Lab Review:     Component Value Date/Time   NA 140 05/25/2022 1848   K 3.8 05/25/2022 1848   CL 107 05/25/2022 1848  CO2 24 05/25/2022 1848   GLUCOSE 97 05/25/2022 1848   BUN 18 05/25/2022 1848   CREATININE 0.85 05/25/2022 1848   CREATININE 0.85 07/04/2021 1605   CALCIUM 9.7 05/25/2022 1848   PROT 6.9 07/04/2021 1605   ALBUMIN 4.3 09/30/2020 1542   AST 21 07/04/2021 1605   ALT 21 07/04/2021 1605   ALKPHOS 61 09/30/2020 1542   BILITOT 0.4 07/04/2021 1605   GFRNONAA >60 05/25/2022 1848       Component Value Date/Time   WBC 9.8 05/25/2022 1848   RBC 4.71 05/25/2022 1848   HGB 14.6 05/25/2022 1848   HCT 42.5 05/25/2022 1848   PLT 246 05/25/2022 1848   MCV 90.2 05/25/2022 1848   MCH 31.0 05/25/2022 1848   MCHC 34.4 05/25/2022 1848   RDW 12.1 05/25/2022 1848   LYMPHSABS 1.4 05/25/2022 1848   MONOABS 0.3 05/25/2022 1848   EOSABS 0.1 05/25/2022 1848   BASOSABS 0.0 05/25/2022 1848    No results found for: "POCLITH", "LITHIUM"   No results found for: "PHENYTOIN", "PHENOBARB", "VALPROATE", "CBMZ"   .res Assessment: Plan:    Plan:  PDMP reviewed  Adderall XR 74m every morning. Effexor 37.546mdaily   RTC 3 months  Patient advised to contact office with any questions, adverse effects, or acute worsening in signs and symptoms.  Discussed potential benefits, risks, and side effects of stimulants with patient to include  increased heart rate, palpitations, insomnia, increased anxiety, increased irritability, or decreased appetite. Instructed patient to contact office if experiencing any significant tolerability issues.  Diagnoses and all orders for this visit:  Major depressive disorder, recurrent episode, moderate (HCC)  Attention deficit hyperactivity disorder (ADHD), unspecified ADHD type -     amphetamine-dextroamphetamine (ADDERALL XR) 25 MG 24 hr capsule; Take 1 capsule by mouth daily. -     amphetamine-dextroamphetamine (ADDERALL XR) 25 MG 24 hr capsule; Take 1 capsule by mouth daily. -     amphetamine-dextroamphetamine (ADDERALL XR) 25 MG 24 hr capsule; Take 1 capsule by mouth daily.  Generalized anxiety disorder  Mixed obsessional thoughts and acts     Please see After Visit Summary for patient specific instructions.  No future appointments.  No orders of the defined types were placed in this encounter.     -------------------------------

## 2023-01-20 ENCOUNTER — Telehealth: Payer: Self-pay | Admitting: Adult Health

## 2023-01-20 MED ORDER — VENLAFAXINE HCL ER 75 MG PO CP24: 75.0000 mg | ORAL_CAPSULE | Freq: Every day | ORAL | 0 refills | Status: DC

## 2023-01-20 NOTE — Telephone Encounter (Signed)
Pt called reporting Effexor 37.5 mg just not as effective as once was. Requesting to increase to next dose. Will Pt need apt for this. Apt 5/20 Pt contact [phone]

## 2023-01-20 NOTE — Telephone Encounter (Signed)
Patient said she saw her therapist yesterday and in discussing her current sx it was thought that she might need an increase in Effexor from 37.5, another med in addition to the Effexor, or another medication altogether.  She is reporting increased depression, fatigue, hopelessness, and irritability.  She is scheduled for F/U 5/20, can make an earlier appt if preferred.

## 2023-01-20 NOTE — Telephone Encounter (Signed)
Rx sent for 7.5mg

## 2023-01-20 NOTE — Telephone Encounter (Signed)
Mailbox is full.

## 2023-01-20 NOTE — Telephone Encounter (Signed)
We can increase the dose of Effexor from 37.5mg  to 75mg  daily.

## 2023-01-21 NOTE — Telephone Encounter (Signed)
LVM with details per DPR.

## 2023-02-22 ENCOUNTER — Other Ambulatory Visit: Payer: Self-pay | Admitting: Adult Health

## 2023-03-01 ENCOUNTER — Encounter: Payer: Self-pay | Admitting: Adult Health

## 2023-03-01 ENCOUNTER — Telehealth (INDEPENDENT_AMBULATORY_CARE_PROVIDER_SITE_OTHER): Payer: BC Managed Care – PPO | Admitting: Adult Health

## 2023-03-01 DIAGNOSIS — F331 Major depressive disorder, recurrent, moderate: Secondary | ICD-10-CM | POA: Diagnosis not present

## 2023-03-01 DIAGNOSIS — F909 Attention-deficit hyperactivity disorder, unspecified type: Secondary | ICD-10-CM | POA: Diagnosis not present

## 2023-03-01 DIAGNOSIS — F422 Mixed obsessional thoughts and acts: Secondary | ICD-10-CM | POA: Diagnosis not present

## 2023-03-01 DIAGNOSIS — F411 Generalized anxiety disorder: Secondary | ICD-10-CM

## 2023-03-01 MED ORDER — AMPHETAMINE-DEXTROAMPHET ER 25 MG PO CP24: 25.0000 mg | ORAL_CAPSULE | Freq: Every day | ORAL | 0 refills | Status: DC

## 2023-03-01 MED ORDER — VENLAFAXINE HCL ER 75 MG PO CP24: ORAL_CAPSULE | ORAL | 2 refills | Status: DC

## 2023-03-01 NOTE — Progress Notes (Signed)
Natalie [name] [id number] 01-12-1986 37 y.o.  Virtual Visit via Video Note  I connected with pt @ on 03/01/23 at  4:40 PM EDT by a video enabled telemedicine application and verified that I am speaking with the correct person using two identifiers.   I discussed the limitations of evaluation and management by telemedicine and the availability of in person appointments. The patient expressed understanding and agreed to proceed.  I discussed the assessment and treatment plan with the patient. The patient was provided an opportunity to ask questions and all were answered. The patient agreed with the plan and demonstrated an understanding of the instructions.   The patient was advised to call back or seek an in-person evaluation if the symptoms worsen or if the condition fails to improve as anticipated.  I provided 25 minutes of non-face-to-face time during this encounter.  The patient was located at home.  The provider was located at [facility].   Dorothyann Gibbs, NP   Subjective:   [name] is a 37 y.o. (DOB 10-09-1986) female.  Chief Complaint: No chief complaint on file.   HPI MARRIN JEKEL presents for follow-up of MDD, GAD, obsessional acts and ADHD.  Describes mood today as "ok". Pleasant. Reports "some" tearfulness. Mood symptoms - reports increased depression, anxiety and irritability. Reports panic attacks weekly - depending on the day. Reports some worry, rumination, and over thinking. Reports skin picking - "it comes and goes"  Mood is lower - not wanting to do things. Reports increased stressors in the work setting. Hoping mood will get better after school year ends. Reports losing grandmother late last year and her 48 year old cat a month ago. Stating "I'm doing ok". Feels like medications are helpful - recently increased dose of Effexor from 37.5mg  to 75mg  daily. She and family doing well. Working with [name] - therapist. Stable interest and  motivation. Taking medications as prescribed. Energy levels stable. Active, does not have a regular exercise routine. Enjoys some usual interests and activities. Married. Lives with husband and son. Family local. Appetite adequate. Weight stable - 160 pounds. Sleeps well most nights. Averages 7 to 8 hours.  Focus and concentration stable with Adderall. Completing tasks. Managing aspects of household. 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 Denies SI or HI.  Denies AH or VH. Denies self harm. Denies substance use.  Previous medication trials: Effexor 37.5mg    Review of Systems:  Review of Systems  Musculoskeletal:  Negative for gait problem.  Neurological:  Negative for tremors.  Psychiatric/Behavioral:         Please refer to HPI    Medications: I have reviewed the patient's current medications.  Current Outpatient Medications  Medication Sig Dispense Refill   amphetamine-dextroamphetamine (ADDERALL XR) 25 MG 24 hr capsule Take 1 capsule by mouth daily. 30 capsule 0   [START ON 03/29/2023] amphetamine-dextroamphetamine (ADDERALL XR) 25 MG 24 hr capsule Take 1 capsule by mouth daily. 30 capsule 0   [START ON 04/26/2023] amphetamine-dextroamphetamine (ADDERALL XR) 25 MG 24 hr capsule Take 1 capsule by mouth daily. 30 capsule 0   cyclobenzaprine (FLEXERIL) 10 MG tablet Take 10 mg by mouth 3 (three) times daily as needed. (Patient not taking: Reported on 05/28/2022)     diclofenac (VOLTAREN) 75 MG EC tablet Take 1 tablet (75 mg total) by mouth 2 (two) times daily as needed for moderate pain. (Patient not taking: Reported on 05/28/2022) 30 tablet 0   gabapentin (NEURONTIN)  100 MG capsule Take 1 to 3 capsules by mouth at bedtime for back pain. 90 capsule 0   levonorgestrel (MIRENA) 20 MCG/24HR IUD by Intrauterine route.     meloxicam (MOBIC) 15 MG tablet Take 15 mg by mouth daily.     methylPREDNISolone (MEDROL DOSEPAK) 4 MG TBPK tablet TAKE 6 TABLETS ON DAY 1 AS DIRECTED ON PACKAGE AND DECREASE  BY 1 TAB EACH DAY FOR A TOTAL OF 6 DAYS     venlafaxine XR (EFFEXOR-XR) 75 MG 24 hr capsule TAKE ONE CAPSULE BY MOUTH ONE TIME DAILY WITH BREAKFAST 30 capsule 2   No current facility-administered medications for this visit.    Medication Side Effects: None  Allergies: No Known Allergies  Past Medical History:  Diagnosis Date   Chronic back pain     Family History  Problem Relation Age of Onset   Cancer Maternal Aunt        breast   Hypertension Paternal Grandmother    Diabetes Paternal Grandfather     Social History   Socioeconomic History   Marital status: Married    Spouse name: Not on file   Number of children: Not on file   Years of education: Not on file   Highest education level: Not on file  Occupational History   Not on file  Tobacco Use   Smoking status: Never   Smokeless tobacco: Never  Substance and Sexual Activity   Alcohol use: Yes    Comment: occassionally   Drug use: No   Sexual activity: Not on file  Other Topics Concern   Not on file  Social History Narrative   Married.   1 child.   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   Social Determinants of Health   Financial Resource Strain: Not on file  Food Insecurity: Not on file  Transportation Needs: Not on file  Physical Activity: Not on file  Stress: Not on file  Social Connections: Not on file  Intimate Partner Violence: Not on file    Past Medical History, Surgical history, Social history, and Family history were reviewed and updated as appropriate.   Please see review of systems for further details on the patient's review from today.   Objective:   Physical Exam:  There were no vitals taken for this visit.  Physical Exam Constitutional:      General: She is not in acute distress. Musculoskeletal:        General: No deformity.  Neurological:     Mental Status: She is alert and oriented to person, place, and time.      Coordination: Coordination normal.  Psychiatric:        Attention and Perception: Attention and perception normal. She does not perceive auditory or visual hallucinations.        Mood and Affect: Mood normal. Mood is not anxious or depressed. Affect is not labile, blunt, angry or inappropriate.        Speech: Speech normal.        Behavior: Behavior normal.        Thought Content: Thought content normal. Thought content is not paranoid or delusional. Thought content does not include homicidal or suicidal ideation. Thought content does not include homicidal or suicidal plan.        Cognition and Memory: Cognition and memory normal.        Judgment: Judgment normal.     Comments: Insight intact  Lab Review:     Component Value Date/Time   NA 140 05/25/2022 1848   K 3.8 05/25/2022 1848   CL 107 05/25/2022 1848   CO2 24 05/25/2022 1848   GLUCOSE 97 05/25/2022 1848   BUN 18 05/25/2022 1848   CREATININE 0.85 05/25/2022 1848   CREATININE 0.85 07/04/2021 1605   CALCIUM 9.7 05/25/2022 1848   PROT 6.9 07/04/2021 1605   ALBUMIN 4.3 09/30/2020 1542   AST 21 07/04/2021 1605   ALT 21 07/04/2021 1605   ALKPHOS 61 09/30/2020 1542   BILITOT 0.4 07/04/2021 1605   GFRNONAA >60 05/25/2022 1848       Component Value Date/Time   WBC 9.8 05/25/2022 1848   RBC 4.71 05/25/2022 1848   HGB 14.6 05/25/2022 1848   HCT 42.5 05/25/2022 1848   PLT 246 05/25/2022 1848   MCV 90.2 05/25/2022 1848   MCH 31.0 05/25/2022 1848   MCHC 34.4 05/25/2022 1848   RDW 12.1 05/25/2022 1848   LYMPHSABS 1.4 05/25/2022 1848   MONOABS 0.3 05/25/2022 1848   EOSABS 0.1 05/25/2022 1848   BASOSABS 0.0 05/25/2022 1848    No results found for: "POCLITH", "LITHIUM"   No results found for: "PHENYTOIN", "PHENOBARB", "VALPROATE", "CBMZ"   .res Assessment: Plan:    Plan:  PDMP reviewed  Adderall XR 25mg  every morning. Effexor 75mg  daily   RTC 3 months  Monitor BP between visits while taking stimulant  medication.   Patient advised to contact office with any questions, adverse effects, or acute worsening in signs and symptoms.  Discussed potential benefits, risks, and side effects of stimulants with patient to include increased heart rate, palpitations, insomnia, increased anxiety, increased irritability, or decreased appetite. Instructed patient to contact office if experiencing any significant tolerability issues.  Diagnoses and all orders for this visit:  Major depressive disorder, recurrent episode, moderate (HCC) -     venlafaxine XR (EFFEXOR-XR) 75 MG 24 hr capsule; TAKE ONE CAPSULE BY MOUTH ONE TIME DAILY WITH BREAKFAST  Attention deficit hyperactivity disorder (ADHD), unspecified ADHD type -     amphetamine-dextroamphetamine (ADDERALL XR) 25 MG 24 hr capsule; Take 1 capsule by mouth daily. -     amphetamine-dextroamphetamine (ADDERALL XR) 25 MG 24 hr capsule; Take 1 capsule by mouth daily. -     amphetamine-dextroamphetamine (ADDERALL XR) 25 MG 24 hr capsule; Take 1 capsule by mouth daily.  Generalized anxiety disorder  Mixed obsessional thoughts and acts     Please see After Visit Summary for patient specific instructions.  No future appointments.  No orders of the defined types were placed in this encounter.     -------------------------------

## 2023-03-19 ENCOUNTER — Other Ambulatory Visit: Payer: Self-pay | Admitting: Adult Health

## 2023-03-19 ENCOUNTER — Telehealth: Payer: Self-pay | Admitting: Adult Health

## 2023-03-19 DIAGNOSIS — F909 Attention-deficit hyperactivity disorder, unspecified type: Secondary | ICD-10-CM

## 2023-03-19 MED ORDER — AMPHETAMINE-DEXTROAMPHET ER 25 MG PO CP24: 25.0000 mg | ORAL_CAPSULE | Freq: Every day | ORAL | 0 refills | Status: DC

## 2023-03-19 NOTE — Telephone Encounter (Signed)
Pt LVM@ 5:20 on 6/6.  She is requesting refill of Adderall to [facility].  It is in stock there.  No upcoming appts scheduled

## 2023-03-19 NOTE — Telephone Encounter (Signed)
Script sent  

## 2023-07-19 ENCOUNTER — Telehealth: Payer: Self-pay | Admitting: Adult Health

## 2023-07-19 ENCOUNTER — Other Ambulatory Visit: Payer: Self-pay

## 2023-07-19 DIAGNOSIS — F909 Attention-deficit hyperactivity disorder, unspecified type: Secondary | ICD-10-CM

## 2023-07-19 NOTE — Telephone Encounter (Signed)
PENDED

## 2023-07-19 NOTE — Telephone Encounter (Signed)
Patient called in for refill on Adderall XR 25mg . Ph: [phone] Appt 10/10 Pharmacy [facility] [address] [place], [place]

## 2023-07-20 MED ORDER — AMPHETAMINE-DEXTROAMPHET ER 25 MG PO CP24: 25.0000 mg | ORAL_CAPSULE | Freq: Every day | ORAL | 0 refills | Status: DC

## 2023-07-22 ENCOUNTER — Encounter: Payer: Self-pay | Admitting: Adult Health

## 2023-07-22 ENCOUNTER — Telehealth (INDEPENDENT_AMBULATORY_CARE_PROVIDER_SITE_OTHER): Payer: BC Managed Care – PPO | Admitting: Adult Health

## 2023-07-22 DIAGNOSIS — F909 Attention-deficit hyperactivity disorder, unspecified type: Secondary | ICD-10-CM

## 2023-07-22 DIAGNOSIS — F339 Major depressive disorder, recurrent, unspecified: Secondary | ICD-10-CM | POA: Diagnosis not present

## 2023-07-22 DIAGNOSIS — F422 Mixed obsessional thoughts and acts: Secondary | ICD-10-CM | POA: Diagnosis not present

## 2023-07-22 DIAGNOSIS — F411 Generalized anxiety disorder: Secondary | ICD-10-CM | POA: Diagnosis not present

## 2023-07-22 DIAGNOSIS — F331 Major depressive disorder, recurrent, moderate: Secondary | ICD-10-CM

## 2023-07-22 MED ORDER — BUSPIRONE HCL 10 MG PO TABS: 10.0000 mg | ORAL_TABLET | Freq: Two times a day (BID) | ORAL | 2 refills | Status: DC

## 2023-07-22 MED ORDER — VENLAFAXINE HCL ER 75 MG PO CP24: ORAL_CAPSULE | ORAL | 2 refills | Status: DC

## 2023-07-22 MED ORDER — AMPHETAMINE-DEXTROAMPHET ER 25 MG PO CP24: 25.0000 mg | ORAL_CAPSULE | Freq: Every day | ORAL | 0 refills | Status: DC

## 2023-07-22 NOTE — Telephone Encounter (Signed)
Pt ended up having medicine sent to [facility] in [place].  Advised pt it was sent there at 5:15p.

## 2023-07-22 NOTE — Progress Notes (Signed)
[name] [id number] December 15, 1985 37 y.o.  Virtual Visit via Video Note  I connected with pt @ on 07/22/23 at  5:00 PM EDT by a video enabled telemedicine application and verified that I am speaking with the correct person using two identifiers.   I discussed the limitations of evaluation and management by telemedicine and the availability of in person appointments. The patient expressed understanding and agreed to proceed.  I discussed the assessment and treatment plan with the patient. The patient was provided an opportunity to ask questions and all were answered. The patient agreed with the plan and demonstrated an understanding of the instructions.   The patient was advised to call back or seek an in-person evaluation if the symptoms worsen or if the condition fails to improve as anticipated.  I provided 25 minutes of non-face-to-face time during this encounter.  The patient was located at home.  The provider was located at [facility].   Dorothyann Gibbs, NP   Subjective:   [name] is a 37 y.o. (DOB 11/24/1985) female.  Chief Complaint: No chief complaint on file.   HPI BERRY GALLACHER presents for follow-up of MDD, GAD, obsessional acts and ADHD.  Describes mood today as "ok". Pleasant. Denies tearfulness. Mood symptoms - reports depression, anxiety and irritability - "at times". Reports decreased panic attacks. Reports some worry, rumination, and over thinking. Reports decreased skin picking. Mood is still on the lower side - feeling tired. Reports decreased stressors in the work setting. Stating "I feel like I'm doing ok". Feels like medications are helpful, but is willing to consider other options. Working with [name] - therapist. Stable interest and motivation. Taking medications as prescribed.   Energy levels lower. Active, does not have a regular exercise routine.   Enjoys some usual interests and activities. Married. Lives with husband and  son. Family local. Appetite adequate. Weight loss - 155 to 160 pounds. Sleeps well most nights. Averages 7 to 8 hours.  Focus and concentration stable with Adderall. Completing tasks. Managing aspects of household. 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 Denies SI or HI.  Denies AH or VH. Denies self harm. Denies substance use.  Previous medication trials: Effexor 37.5mg    Review of Systems:  Review of Systems  Musculoskeletal:  Negative for gait problem.  Neurological:  Negative for tremors.  Psychiatric/Behavioral:         Please refer to HPI     Medications: I have reviewed the patient's current medications.  Current Outpatient Medications  Medication Sig Dispense Refill   amphetamine-dextroamphetamine (ADDERALL XR) 25 MG 24 hr capsule Take 1 capsule by mouth daily. 30 capsule 0   amphetamine-dextroamphetamine (ADDERALL XR) 25 MG 24 hr capsule Take 1 capsule by mouth daily. 30 capsule 0   amphetamine-dextroamphetamine (ADDERALL XR) 25 MG 24 hr capsule Take 1 capsule by mouth daily. 30 capsule 0   cyclobenzaprine (FLEXERIL) 10 MG tablet Take 10 mg by mouth 3 (three) times daily as needed. (Patient not taking: Reported on 05/28/2022)     diclofenac (VOLTAREN) 75 MG EC tablet Take 1 tablet (75 mg total) by mouth 2 (two) times daily as needed for moderate pain. (Patient not taking: Reported on 05/28/2022) 30 tablet 0   gabapentin (NEURONTIN) 100 MG capsule Take 1 to 3 capsules by mouth at bedtime for back pain. 90 capsule 0   levonorgestrel (MIRENA) 20 MCG/24HR IUD by Intrauterine route.     meloxicam (MOBIC) 15 MG tablet  Take 15 mg by mouth daily.     methylPREDNISolone (MEDROL DOSEPAK) 4 MG TBPK tablet TAKE 6 TABLETS ON DAY 1 AS DIRECTED ON PACKAGE AND DECREASE BY 1 TAB EACH DAY FOR A TOTAL OF 6 DAYS     venlafaxine XR (EFFEXOR-XR) 75 MG 24 hr capsule TAKE ONE CAPSULE BY MOUTH ONE TIME DAILY WITH BREAKFAST 30 capsule 2   No current facility-administered medications for this visit.     Medication Side Effects: None  Allergies: No Known Allergies  Past Medical History:  Diagnosis Date   Chronic back pain     Family History  Problem Relation Age of Onset   Cancer Maternal Aunt        breast   Hypertension Paternal Grandmother    Diabetes Paternal Grandfather     Social History   Socioeconomic History   Marital status: Married    Spouse name: Not on file   Number of children: Not on file   Years of education: Not on file   Highest education level: Not on file  Occupational History   Not on file  Tobacco Use   Smoking status: Never   Smokeless tobacco: Never  Substance and Sexual Activity   Alcohol use: Yes    Comment: occassionally   Drug use: No   Sexual activity: Not on file  Other Topics Concern   Not on file  Social History Narrative   Married.   1 child.   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   Social Determinants of Health   Financial Resource Strain: Not on file  Food Insecurity: Not on file  Transportation Needs: Not on file  Physical Activity: Not on file  Stress: Not on file  Social Connections: Not on file  Intimate Partner Violence: Not on file    Past Medical History, Surgical history, Social history, and Family history were reviewed and updated as appropriate.   Please see review of systems for further details on the patient's review from today.   Objective:   Physical Exam:  There were no vitals taken for this visit.  Physical Exam Constitutional:      General: She is not in acute distress. Musculoskeletal:        General: No deformity.  Neurological:     Mental Status: She is alert and oriented to person, place, and time.     Coordination: Coordination normal.  Psychiatric:        Attention and Perception: Attention and perception normal. She does not perceive auditory or visual hallucinations.        Mood and Affect: Mood normal. Mood is not anxious or  depressed. Affect is not labile, blunt, angry or inappropriate.        Speech: Speech normal.        Behavior: Behavior normal.        Thought Content: Thought content normal. Thought content is not paranoid or delusional. Thought content does not include homicidal or suicidal ideation. Thought content does not include homicidal or suicidal plan.        Cognition and Memory: Cognition and memory normal.        Judgment: Judgment normal.     Comments: Insight intact     Lab Review:     Component Value Date/Time   NA 140 05/25/2022 1848   K 3.8 05/25/2022 1848   CL 107 05/25/2022 1848   CO2 24 05/25/2022 1848   GLUCOSE  97 05/25/2022 1848   BUN 18 05/25/2022 1848   CREATININE 0.85 05/25/2022 1848   CREATININE 0.85 07/04/2021 1605   CALCIUM 9.7 05/25/2022 1848   PROT 6.9 07/04/2021 1605   ALBUMIN 4.3 09/30/2020 1542   AST 21 07/04/2021 1605   ALT 21 07/04/2021 1605   ALKPHOS 61 09/30/2020 1542   BILITOT 0.4 07/04/2021 1605   GFRNONAA >60 05/25/2022 1848       Component Value Date/Time   WBC 9.8 05/25/2022 1848   RBC 4.71 05/25/2022 1848   HGB 14.6 05/25/2022 1848   HCT 42.5 05/25/2022 1848   PLT 246 05/25/2022 1848   MCV 90.2 05/25/2022 1848   MCH 31.0 05/25/2022 1848   MCHC 34.4 05/25/2022 1848   RDW 12.1 05/25/2022 1848   LYMPHSABS 1.4 05/25/2022 1848   MONOABS 0.3 05/25/2022 1848   EOSABS 0.1 05/25/2022 1848   BASOSABS 0.0 05/25/2022 1848    No results found for: "POCLITH", "LITHIUM"   No results found for: "PHENYTOIN", "PHENOBARB", "VALPROATE", "CBMZ"   .res Assessment: Plan:    Plan:  PDMP reviewed  Adderall XR 25mg  every morning. Effexor 75mg  daily   Add Buspar 10mg  BID for anxiety  RTC 3 months  Monitor BP between visits while taking stimulant medication.   Patient advised to contact office with any questions, adverse effects, or acute worsening in signs and symptoms.  Discussed potential benefits, risks, and side effects of stimulants with  patient to include increased heart rate, palpitations, insomnia, increased anxiety, increased irritability, or decreased appetite. Instructed patient to contact office if experiencing any significant tolerability issues.  There are no diagnoses linked to this encounter.   Please see After Visit Summary for patient specific instructions.  No future appointments.  No orders of the defined types were placed in this encounter.     -------------------------------

## 2023-07-23 NOTE — Telephone Encounter (Signed)
LVM to [facility]. I don't know if patient was able to get her medication or not. It doesn't show that it has been filled in the database.

## 2023-07-26 NOTE — Telephone Encounter (Signed)
Database shows filled 10/10 for 25 ER.

## 2023-08-14 ENCOUNTER — Other Ambulatory Visit: Payer: Self-pay | Admitting: Adult Health

## 2023-08-14 DIAGNOSIS — F411 Generalized anxiety disorder: Secondary | ICD-10-CM

## 2023-08-14 DIAGNOSIS — F331 Major depressive disorder, recurrent, moderate: Secondary | ICD-10-CM

## 2023-08-31 ENCOUNTER — Other Ambulatory Visit: Payer: Self-pay

## 2023-08-31 ENCOUNTER — Telehealth: Payer: Self-pay | Admitting: Adult Health

## 2023-08-31 DIAGNOSIS — F909 Attention-deficit hyperactivity disorder, unspecified type: Secondary | ICD-10-CM

## 2023-08-31 MED ORDER — AMPHETAMINE-DEXTROAMPHET ER 25 MG PO CP24: 25.0000 mg | ORAL_CAPSULE | Freq: Every day | ORAL | 0 refills | Status: DC

## 2023-08-31 NOTE — Telephone Encounter (Signed)
Natalie [name] called to request that her prescription for Adderall XR 25mg  [facility] [address] [place], [place].  She asked that of her Adderall prescriptions be transferred but I don't see that she has any.  She has appt 10/19/23.

## 2023-08-31 NOTE — Telephone Encounter (Signed)
Canceled at org [facility] pharmacy and pended xr 25 mg Adderall to requested pharmacy.

## 2023-10-01 ENCOUNTER — Other Ambulatory Visit: Payer: Self-pay

## 2023-10-01 ENCOUNTER — Telehealth: Payer: Self-pay | Admitting: Adult Health

## 2023-10-01 DIAGNOSIS — F909 Attention-deficit hyperactivity disorder, unspecified type: Secondary | ICD-10-CM

## 2023-10-01 MED ORDER — AMPHETAMINE-DEXTROAMPHET ER 25 MG PO CP24: 25.0000 mg | ORAL_CAPSULE | Freq: Every day | ORAL | 0 refills | Status: DC

## 2023-10-01 NOTE — Telephone Encounter (Signed)
Pt requests a RF of Adderall 25mg  XR , Appt 10/19/23 [facility] send to: [id number] [place], [place] - [address]  [address],

## 2023-10-01 NOTE — Telephone Encounter (Signed)
[facility]

## 2023-10-19 ENCOUNTER — Telehealth: Payer: 59 | Admitting: Adult Health

## 2023-10-19 ENCOUNTER — Encounter: Payer: Self-pay | Admitting: Adult Health

## 2023-10-19 DIAGNOSIS — F331 Major depressive disorder, recurrent, moderate: Secondary | ICD-10-CM

## 2023-10-19 DIAGNOSIS — F422 Mixed obsessional thoughts and acts: Secondary | ICD-10-CM | POA: Diagnosis not present

## 2023-10-19 DIAGNOSIS — F411 Generalized anxiety disorder: Secondary | ICD-10-CM | POA: Diagnosis not present

## 2023-10-19 DIAGNOSIS — F909 Attention-deficit hyperactivity disorder, unspecified type: Secondary | ICD-10-CM

## 2023-10-19 MED ORDER — AMPHETAMINE-DEXTROAMPHET ER 30 MG PO CP24
30.0000 mg | ORAL_CAPSULE | Freq: Every day | ORAL | 0 refills | Status: DC
Start: 2023-11-16 — End: 2024-04-04

## 2023-10-19 MED ORDER — VENLAFAXINE HCL ER 75 MG PO CP24: ORAL_CAPSULE | ORAL | 0 refills | Status: DC

## 2023-10-19 MED ORDER — BUSPIRONE HCL 30 MG PO TABS
30.0000 mg | ORAL_TABLET | Freq: Every day | ORAL | 2 refills | Status: DC
Start: 2023-10-19 — End: 2023-11-16

## 2023-10-19 MED ORDER — BUSPIRONE HCL 10 MG PO TABS: 10.0000 mg | ORAL_TABLET | Freq: Two times a day (BID) | ORAL | 2 refills | Status: DC

## 2023-10-19 MED ORDER — AMPHETAMINE-DEXTROAMPHET ER 30 MG PO CP24: 30.0000 mg | ORAL_CAPSULE | Freq: Every day | ORAL | 0 refills | Status: DC

## 2023-10-19 MED ORDER — AMPHETAMINE-DEXTROAMPHET ER 30 MG PO CP24
30.0000 mg | ORAL_CAPSULE | Freq: Every day | ORAL | 0 refills | Status: DC
Start: 2023-12-14 — End: 2024-04-12

## 2023-10-19 NOTE — Progress Notes (Signed)
 Natalie [name] [id number] 1986/05/07 38 y.o.  Virtual Visit via Video Note  I connected with pt @ on 10/19/23 at  4:00 PM EST by a video enabled telemedicine application and verified that I am speaking with the correct person using two identifiers.   I discussed the limitations of evaluation and management by telemedicine and the availability of in person appointments. The patient expressed understanding and agreed to proceed.  I discussed the assessment and treatment plan with the patient. The patient was provided an opportunity to ask questions and all were answered. The patient agreed with the plan and demonstrated an understanding of the instructions.   The patient was advised to call back or seek an in-person evaluation if the symptoms worsen or if the condition fails to improve as anticipated.  I provided 25 minutes of non-face-to-face time during this encounter.  The patient was located at home.  The provider was located at [facility].   Natalie LOISE Sayers, NP   Subjective:   [name] is a 38 y.o. (DOB 1986-06-14) female.  Chief Complaint: No chief complaint on file.   HPI Natalie [name] presents for follow-up of MDD, GAD, mixed obsessional thoughts and acts and ADHD.  Describes mood today as so-so. Pleasant. Denies tearfulness. Mood symptoms - reports some depression, anxiety and irritability - the holidays were a little hard this year. Reports panic attacks. Reports some worry, rumination, and over thinking. Reports feeling up and down - I can't get control of my emotions. Reports increased skin picking over the past month. Mood fluctuates. Stating I feel like it's been a struggle. Feels like medications are helpful. Working with Donzell Rend - therapist. Reports varying interest and motivation. Taking medications as prescribed.   Energy levels lower. Active, does not have a regular exercise routine.   Enjoys some usual interests and activities.  Married. Lives with husband and son. Family local. Appetite adequate. Weight loss - 155 to 160 pounds. Sleeps well most nights. Averages 8 to 10 hours. Reports some daytime napping. Focus and concentration stable with Adderall . Completing tasks. Managing aspects of household. 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 Denies SI or HI.  Denies AH or VH. Denies self harm. Denies substance use.  Previous medication trials: Effexor  37.5mg    Review of Systems:  Review of Systems  Musculoskeletal:  Negative for gait problem.  Neurological:  Negative for tremors.  Psychiatric/Behavioral:         Please refer to HPI    Medications: I have reviewed the patient's current medications.  Current Outpatient Medications  Medication Sig Dispense Refill   amphetamine -dextroamphetamine  (ADDERALL  XR) 25 MG 24 hr capsule Take 1 capsule by mouth daily. 30 capsule 0   amphetamine -dextroamphetamine  (ADDERALL  XR) 25 MG 24 hr capsule Take 1 capsule by mouth daily. 30 capsule 0   amphetamine -dextroamphetamine  (ADDERALL  XR) 25 MG 24 hr capsule Take 1 capsule by mouth daily. 30 capsule 0   busPIRone  (BUSPAR ) 10 MG tablet Take 1 tablet (10 mg total) by mouth 2 (two) times daily. 60 tablet 2   cyclobenzaprine  (FLEXERIL ) 10 MG tablet Take 10 mg by mouth 3 (three) times daily as needed. (Patient not taking: Reported on 05/28/2022)     diclofenac  (VOLTAREN ) 75 MG EC tablet Take 1 tablet (75 mg total) by mouth 2 (two) times daily as needed for moderate pain. (Patient not taking: Reported on 05/28/2022) 30 tablet 0   gabapentin  (NEURONTIN ) 100 MG capsule Take 1 to 3  capsules by mouth at bedtime for back pain. 90 capsule 0   levonorgestrel (MIRENA) 20 MCG/24HR IUD by Intrauterine route.     meloxicam (MOBIC) 15 MG tablet Take 15 mg by mouth daily.     methylPREDNISolone  (MEDROL  DOSEPAK) 4 MG TBPK tablet TAKE 6 TABLETS ON DAY 1 AS DIRECTED ON PACKAGE AND DECREASE BY 1 TAB EACH DAY FOR A TOTAL OF 6 DAYS     venlafaxine  XR  (EFFEXOR -XR) 75 MG 24 hr capsule TAKE ONE CAPSULE BY MOUTH ONE TIME DAILY WITH BREAKFAST 90 capsule 0   No current facility-administered medications for this visit.    Medication Side Effects: None  Allergies: No Known Allergies  Past Medical History:  Diagnosis Date   Chronic back pain     Family History  Problem Relation Age of Onset   Cancer Maternal Aunt        breast   Hypertension Paternal Grandmother    Diabetes Paternal Grandfather     Social History   Socioeconomic History   Marital status: Married    Spouse name: Not on file   Number of children: Not on file   Years of education: Not on file   Highest education level: Not on file  Occupational History   Not on file  Tobacco Use   Smoking status: Never   Smokeless tobacco: Never  Substance and Sexual Activity   Alcohol use: Yes    Comment: occassionally   Drug use: No   Sexual activity: Not on file  Other Topics Concern   Not on file  Social History Narrative   Married.   1 child.   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   Social Drivers of Corporate Investment Banker Strain: Not on file  Food Insecurity: Not on file  Transportation Needs: Not on file  Physical Activity: Not on file  Stress: Not on file  Social Connections: Not on file  Intimate Partner Violence: Not on file    Past Medical History, Surgical history, Social history, and Family history were reviewed and updated as appropriate.   Please see review of systems for further details on the patient's review from today.   Objective:   Physical Exam:  There were no vitals taken for this visit.  Physical Exam Constitutional:      General: She is not in acute distress. Musculoskeletal:        General: No deformity.  Neurological:     Mental Status: She is alert and oriented to person, place, and time.     Coordination: Coordination normal.  Psychiatric:        Attention and  Perception: Attention and perception normal. She does not perceive auditory or visual hallucinations.        Mood and Affect: Affect is not labile, blunt, angry or inappropriate.        Speech: Speech normal.        Behavior: Behavior normal.        Thought Content: Thought content normal. Thought content is not paranoid or delusional. Thought content does not include homicidal or suicidal ideation. Thought content does not include homicidal or suicidal plan.        Cognition and Memory: Cognition and memory normal.        Judgment: Judgment normal.     Comments: Insight intact     Lab Review:     Component Value Date/Time   NA 140 05/25/2022  1848   K 3.8 05/25/2022 1848   CL 107 05/25/2022 1848   CO2 24 05/25/2022 1848   GLUCOSE 97 05/25/2022 1848   BUN 18 05/25/2022 1848   CREATININE 0.85 05/25/2022 1848   CREATININE 0.85 07/04/2021 1605   CALCIUM 9.7 05/25/2022 1848   PROT 6.9 07/04/2021 1605   ALBUMIN 4.3 09/30/2020 1542   AST 21 07/04/2021 1605   ALT 21 07/04/2021 1605   ALKPHOS 61 09/30/2020 1542   BILITOT 0.4 07/04/2021 1605   GFRNONAA >60 05/25/2022 1848       Component Value Date/Time   WBC 9.8 05/25/2022 1848   RBC 4.71 05/25/2022 1848   HGB 14.6 05/25/2022 1848   HCT 42.5 05/25/2022 1848   PLT 246 05/25/2022 1848   MCV 90.2 05/25/2022 1848   MCH 31.0 05/25/2022 1848   MCHC 34.4 05/25/2022 1848   RDW 12.1 05/25/2022 1848   LYMPHSABS 1.4 05/25/2022 1848   MONOABS 0.3 05/25/2022 1848   EOSABS 0.1 05/25/2022 1848   BASOSABS 0.0 05/25/2022 1848    No results found for: POCLITH, LITHIUM   No results found for: PHENYTOIN, PHENOBARB, VALPROATE, CBMZ   .res Assessment: Plan:    Plan:  PDMP reviewed  Adderall  XR 25mg  to 30mg  every morning. Effexor  75mg  daily  Buspar  30mg  daily for anxiety  RTC 4 weeks  Monitor BP between visits while taking stimulant medication.   Patient advised to contact office with any questions, adverse effects, or  acute worsening in signs and symptoms.  Discussed potential benefits, risks, and side effects of stimulants with patient to include increased heart rate, palpitations, insomnia, increased anxiety, increased irritability, or decreased appetite. Instructed patient to contact office if experiencing any significant tolerability issues.  There are no diagnoses linked to this encounter.   Please see After Visit Summary for patient specific instructions.  Future Appointments  Date Time Provider Department Center  10/19/2023  4:00 PM Kingsten Enfield Nattalie, NP [facility] None    No orders of the defined types were placed in this encounter.     -------------------------------

## 2023-10-20 ENCOUNTER — Telehealth: Payer: Self-pay | Admitting: Adult Health

## 2023-10-20 ENCOUNTER — Telehealth: Payer: Self-pay

## 2023-10-20 NOTE — Telephone Encounter (Addendum)
 Prior Authorization Amphetamine-Dextroamphet ER 30MG  er capsules #30/30 [employer]  Approved Effective:  10/21/23-10/20/26

## 2023-10-20 NOTE — Telephone Encounter (Signed)
 Pt called and said that [facility] doesn't have her adderall xr 30 mg in stock. [facility] [address] [facility] [place]

## 2023-10-21 ENCOUNTER — Other Ambulatory Visit: Payer: Self-pay

## 2023-10-21 DIAGNOSIS — F909 Attention-deficit hyperactivity disorder, unspecified type: Secondary | ICD-10-CM

## 2023-10-21 MED ORDER — AMPHETAMINE-DEXTROAMPHET ER 30 MG PO CP24: 30.0000 mg | ORAL_CAPSULE | Freq: Every day | ORAL | 0 refills | Status: DC

## 2023-10-21 NOTE — Telephone Encounter (Signed)
 Canceled at [facility] and [facility] [address] Jeanene Erb patient to be sure this was the [facility], as there are 2 on [address] [employer]

## 2023-11-13 ENCOUNTER — Other Ambulatory Visit: Payer: Self-pay | Admitting: Adult Health

## 2023-11-13 DIAGNOSIS — F331 Major depressive disorder, recurrent, moderate: Secondary | ICD-10-CM

## 2023-11-16 ENCOUNTER — Encounter: Payer: Self-pay | Admitting: Adult Health

## 2023-11-16 ENCOUNTER — Telehealth: Payer: 59 | Admitting: Adult Health

## 2023-11-16 DIAGNOSIS — F909 Attention-deficit hyperactivity disorder, unspecified type: Secondary | ICD-10-CM | POA: Diagnosis not present

## 2023-11-16 DIAGNOSIS — F422 Mixed obsessional thoughts and acts: Secondary | ICD-10-CM

## 2023-11-16 DIAGNOSIS — F331 Major depressive disorder, recurrent, moderate: Secondary | ICD-10-CM

## 2023-11-16 DIAGNOSIS — F411 Generalized anxiety disorder: Secondary | ICD-10-CM

## 2023-11-16 MED ORDER — AMPHETAMINE-DEXTROAMPHET ER 30 MG PO CP24: 30.0000 mg | ORAL_CAPSULE | Freq: Every day | ORAL | 0 refills | Status: DC

## 2023-11-16 MED ORDER — BUSPIRONE HCL 30 MG PO TABS: 30.0000 mg | ORAL_TABLET | Freq: Every day | ORAL | 2 refills | Status: DC

## 2023-11-16 MED ORDER — VENLAFAXINE HCL ER 75 MG PO CP24: ORAL_CAPSULE | ORAL | 0 refills | Status: DC

## 2023-11-16 NOTE — Progress Notes (Signed)
 Natalie [name] [id number] 1986-05-04 38 y.o.  Virtual Visit via Video Note  I connected with pt @ on 11/16/23 at  4:30 PM EST by a video enabled telemedicine application and verified that I am speaking with the correct person using two identifiers.   I discussed the limitations of evaluation and management by telemedicine and the availability of in person appointments. The patient expressed understanding and agreed to proceed.  I discussed the assessment and treatment plan with the patient. The patient was provided an opportunity to ask questions and all were answered. The patient agreed with the plan and demonstrated an understanding of the instructions.   The patient was advised to call back or seek an in-person evaluation if the symptoms worsen or if the condition fails to improve as anticipated.  I provided 25 minutes of non-face-to-face time during this encounter.  The patient was located at home.  The provider was located at [facility].   Natalie LOISE Sayers, NP   Subjective:   [name] is a 38 y.o. (DOB 04-Aug-1986) female.  Chief Complaint: No chief complaint on file.   HPI ASHTEN SARNOWSKI presents for follow-up of MDD, GAD, mixed obsessional thoughts and acts and ADHD.  Describes mood today as ok. Pleasant. Reports tearfulness. Mood symptoms - reports decreased depression, anxiety and irritability. Reports panic attacks. Reports some worry, rumination, and over thinking. Reports skin picking Reports mood is more stable - it seems more level. Feels like she has better control of her emotions. Working with Donzell Rend - therapist. Reports varying interest and motivation. Taking medications as prescribed.   Energy levels better. Active, does not have a regular exercise routine.   Enjoys some usual interests and activities. Married. Lives with husband and son. Family local. Appetite adequate. Weight loss - 155 to 160 pounds. Sleeps well most nights.  Averages 8 to 10 hours.  Focus and concentration stable with Adderall . Completing tasks. Managing aspects of household. Working full time as a Horticulturist, Commercial. Denies SI or HI.  Denies AH or VH. Denies self harm. Denies substance use.  Previous medication trials: Effexor  37.5mg    Review of Systems:  Review of Systems  Musculoskeletal:  Negative for gait problem.  Neurological:  Negative for tremors.  Psychiatric/Behavioral:         Please refer to HPI    Medications: I have reviewed the patient's current medications.  Current Outpatient Medications  Medication Sig Dispense Refill   amphetamine -dextroamphetamine  (ADDERALL  XR) 30 MG 24 hr capsule Take 1 capsule (30 mg total) by mouth daily. 30 capsule 0   [START ON 12/14/2023] amphetamine -dextroamphetamine  (ADDERALL  XR) 30 MG 24 hr capsule Take 1 capsule (30 mg total) by mouth daily. 30 capsule 0   amphetamine -dextroamphetamine  (ADDERALL  XR) 30 MG 24 hr capsule Take 1 capsule (30 mg total) by mouth daily. 30 capsule 0   busPIRone  (BUSPAR ) 30 MG tablet Take 1 tablet (30 mg total) by mouth daily. 30 tablet 2   cyclobenzaprine  (FLEXERIL ) 10 MG tablet Take 10 mg by mouth 3 (three) times daily as needed. (Patient not taking: Reported on 05/28/2022)     diclofenac  (VOLTAREN ) 75 MG EC tablet Take 1 tablet (75 mg total) by mouth 2 (two) times daily as needed for moderate pain. (Patient not taking: Reported on 05/28/2022) 30 tablet 0   gabapentin  (NEURONTIN ) 100 MG capsule Take 1 to 3 capsules by mouth at bedtime for back pain. 90 capsule 0   levonorgestrel (MIRENA) 20 MCG/24HR IUD  by Intrauterine route.     meloxicam (MOBIC) 15 MG tablet Take 15 mg by mouth daily.     methylPREDNISolone  (MEDROL  DOSEPAK) 4 MG TBPK tablet TAKE 6 TABLETS ON DAY 1 AS DIRECTED ON PACKAGE AND DECREASE BY 1 TAB EACH DAY FOR A TOTAL OF 6 DAYS     venlafaxine  XR (EFFEXOR -XR) 75 MG 24 hr capsule TAKE ONE CAPSULE BY MOUTH ONE TIME DAILY WITH BREAKFAST 30 capsule 0   No current  facility-administered medications for this visit.    Medication Side Effects: None  Allergies: No Known Allergies  Past Medical History:  Diagnosis Date   Chronic back pain     Family History  Problem Relation Age of Onset   Cancer Maternal Aunt        breast   Hypertension Paternal Grandmother    Diabetes Paternal Grandfather     Social History   Socioeconomic History   Marital status: Married    Spouse name: Not on file   Number of children: Not on file   Years of education: Not on file   Highest education level: Not on file  Occupational History   Not on file  Tobacco Use   Smoking status: Never   Smokeless tobacco: Never  Substance and Sexual Activity   Alcohol use: Yes    Comment: occassionally   Drug use: No   Sexual activity: Not on file  Other Topics Concern   Not on file  Social History Narrative   Married.   1 child.   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   Social Drivers of Corporate Investment Banker Strain: Not on file  Food Insecurity: Not on file  Transportation Needs: Not on file  Physical Activity: Not on file  Stress: Not on file  Social Connections: Not on file  Intimate Partner Violence: Not on file    Past Medical History, Surgical history, Social history, and Family history were reviewed and updated as appropriate.   Please see review of systems for further details on the patient's review from today.   Objective:   Physical Exam:  There were no vitals taken for this visit.  Physical Exam Constitutional:      General: She is not in acute distress. Musculoskeletal:        General: No deformity.  Neurological:     Mental Status: She is alert and oriented to person, place, and time.     Coordination: Coordination normal.  Psychiatric:        Attention and Perception: Attention and perception normal. She does not perceive auditory or visual hallucinations.        Mood and  Affect: Affect is not labile, blunt, angry or inappropriate.        Speech: Speech normal.        Behavior: Behavior normal.        Thought Content: Thought content normal. Thought content is not paranoid or delusional. Thought content does not include homicidal or suicidal ideation. Thought content does not include homicidal or suicidal plan.        Cognition and Memory: Cognition and memory normal.        Judgment: Judgment normal.     Comments: Insight intact     Lab Review:     Component Value Date/Time   NA 140 05/25/2022 1848   K 3.8 05/25/2022 1848   CL 107 05/25/2022 1848   CO2 24 05/25/2022  1848   GLUCOSE 97 05/25/2022 1848   BUN 18 05/25/2022 1848   CREATININE 0.85 05/25/2022 1848   CREATININE 0.85 07/04/2021 1605   CALCIUM 9.7 05/25/2022 1848   PROT 6.9 07/04/2021 1605   ALBUMIN 4.3 09/30/2020 1542   AST 21 07/04/2021 1605   ALT 21 07/04/2021 1605   ALKPHOS 61 09/30/2020 1542   BILITOT 0.4 07/04/2021 1605   GFRNONAA >60 05/25/2022 1848       Component Value Date/Time   WBC 9.8 05/25/2022 1848   RBC 4.71 05/25/2022 1848   HGB 14.6 05/25/2022 1848   HCT 42.5 05/25/2022 1848   PLT 246 05/25/2022 1848   MCV 90.2 05/25/2022 1848   MCH 31.0 05/25/2022 1848   MCHC 34.4 05/25/2022 1848   RDW 12.1 05/25/2022 1848   LYMPHSABS 1.4 05/25/2022 1848   MONOABS 0.3 05/25/2022 1848   EOSABS 0.1 05/25/2022 1848   BASOSABS 0.0 05/25/2022 1848    No results found for: POCLITH, LITHIUM   No results found for: PHENYTOIN, PHENOBARB, VALPROATE, CBMZ   .res Assessment: Plan:    Plan:  PDMP reviewed  Adderall  XR 30mg  every morning. Effexor  75mg  daily  Buspar  30mg  daily for anxiety  RTC 4 weeks  Monitor BP between visits while taking stimulant medication.   25 minutes spent dedicated to the care of this patient on the date of this encounter to include pre-visit review of records, ordering of medication, post visit documentation, and face-to-face time  with the patient discussing MDD, GAD, mixed obsessional thoughts and acts and ADHD. Discussed continuing current medication regimen.  Patient advised to contact office with any questions, adverse effects, or acute worsening in signs and symptoms.  Discussed potential benefits, risks, and side effects of stimulants with patient to include increased heart rate, palpitations, insomnia, increased anxiety, increased irritability, or decreased appetite. Instructed patient to contact office if experiencing any significant tolerability issues.  Diagnoses and all orders for this visit:  Major depressive disorder, recurrent episode, moderate (HCC) -     venlafaxine  XR (EFFEXOR -XR) 75 MG 24 hr capsule; TAKE ONE CAPSULE BY MOUTH ONE TIME DAILY WITH BREAKFAST  Generalized anxiety disorder -     busPIRone  (BUSPAR ) 30 MG tablet; Take 1 tablet (30 mg total) by mouth daily.  Attention deficit hyperactivity disorder (ADHD), unspecified ADHD type -     amphetamine -dextroamphetamine  (ADDERALL  XR) 30 MG 24 hr capsule; Take 1 capsule (30 mg total) by mouth daily.  Mixed obsessional thoughts and acts     Please see After Visit Summary for patient specific instructions.  No future appointments.   No orders of the defined types were placed in this encounter.     -------------------------------

## 2023-12-15 ENCOUNTER — Telehealth: Payer: 59 | Admitting: Adult Health

## 2024-02-21 ENCOUNTER — Other Ambulatory Visit: Payer: Self-pay | Admitting: Adult Health

## 2024-02-21 DIAGNOSIS — F411 Generalized anxiety disorder: Secondary | ICD-10-CM

## 2024-03-07 ENCOUNTER — Telehealth: Payer: Self-pay | Admitting: Adult Health

## 2024-03-07 NOTE — Telephone Encounter (Signed)
Sent MyChart message to schedule

## 2024-03-07 NOTE — Telephone Encounter (Signed)
 Pt lvm requesting apt and RF Adderall  XR 30 mg to [facility]. Called to schedule apt mail box full. Will try again.

## 2024-03-11 ENCOUNTER — Other Ambulatory Visit: Payer: Self-pay | Admitting: Adult Health

## 2024-03-11 DIAGNOSIS — F331 Major depressive disorder, recurrent, moderate: Secondary | ICD-10-CM

## 2024-03-12 NOTE — Telephone Encounter (Signed)
 Please call to schedule FU, has not responded to [employer].

## 2024-03-28 ENCOUNTER — Other Ambulatory Visit: Payer: Self-pay | Admitting: Adult Health

## 2024-03-28 DIAGNOSIS — F411 Generalized anxiety disorder: Secondary | ICD-10-CM

## 2024-03-29 NOTE — Telephone Encounter (Signed)
 Spoke with husband and told him she needed to schedule an appt.

## 2024-04-03 ENCOUNTER — Telehealth: Payer: Self-pay | Admitting: Adult Health

## 2024-04-03 NOTE — Telephone Encounter (Signed)
 Patient called in for refill on Adderall  30mg . Ph: [phone] appt 7/2 Pharmacy [facility] [address] [place], [place]

## 2024-04-03 NOTE — Telephone Encounter (Signed)
 LF 5/27, due 6/24

## 2024-04-04 ENCOUNTER — Other Ambulatory Visit: Payer: Self-pay

## 2024-04-04 DIAGNOSIS — F909 Attention-deficit hyperactivity disorder, unspecified type: Secondary | ICD-10-CM

## 2024-04-04 MED ORDER — AMPHETAMINE-DEXTROAMPHET ER 30 MG PO CP24: 30.0000 mg | ORAL_CAPSULE | Freq: Every day | ORAL | 0 refills | Status: DC

## 2024-04-04 NOTE — Telephone Encounter (Signed)
 Pended Adderall  XR 30, #8 to cover until appt

## 2024-04-11 ENCOUNTER — Other Ambulatory Visit: Payer: Self-pay | Admitting: Adult Health

## 2024-04-11 DIAGNOSIS — F331 Major depressive disorder, recurrent, moderate: Secondary | ICD-10-CM

## 2024-04-11 NOTE — Telephone Encounter (Signed)
 Has appt. tomorrow

## 2024-04-12 ENCOUNTER — Encounter: Payer: Self-pay | Admitting: Adult Health

## 2024-04-12 ENCOUNTER — Telehealth: Admitting: Adult Health

## 2024-04-12 DIAGNOSIS — F909 Attention-deficit hyperactivity disorder, unspecified type: Secondary | ICD-10-CM | POA: Diagnosis not present

## 2024-04-12 DIAGNOSIS — F422 Mixed obsessional thoughts and acts: Secondary | ICD-10-CM | POA: Diagnosis not present

## 2024-04-12 DIAGNOSIS — F411 Generalized anxiety disorder: Secondary | ICD-10-CM | POA: Diagnosis not present

## 2024-04-12 DIAGNOSIS — F331 Major depressive disorder, recurrent, moderate: Secondary | ICD-10-CM

## 2024-04-12 MED ORDER — BUSPIRONE HCL 30 MG PO TABS: 30.0000 mg | ORAL_TABLET | Freq: Every day | ORAL | 2 refills | Status: DC

## 2024-04-12 MED ORDER — AMPHETAMINE-DEXTROAMPHET ER 30 MG PO CP24: 30.0000 mg | ORAL_CAPSULE | Freq: Every day | ORAL | 0 refills | Status: DC

## 2024-04-12 MED ORDER — VENLAFAXINE HCL ER 75 MG PO CP24: ORAL_CAPSULE | ORAL | 2 refills | Status: DC

## 2024-04-12 NOTE — Progress Notes (Signed)
 Natalie [name] [id number] 1985/12/18 38 y.o.  Virtual Visit via Video Note  I connected with pt @ on 04/12/24 at  8:30 AM EDT by a video enabled telemedicine application and verified that I am speaking with the correct person using two identifiers.   I discussed the limitations of evaluation and management by telemedicine and the availability of in person appointments. The patient expressed understanding and agreed to proceed.  I discussed the assessment and treatment plan with the patient. The patient was provided an opportunity to ask questions and all were answered. The patient agreed with the plan and demonstrated an understanding of the instructions.   The patient was advised to call back or seek an in-person evaluation if the symptoms worsen or if the condition fails to improve as anticipated.  I provided 25 minutes of non-face-to-face time during this encounter.  The patient was located at home.  The provider was located at [facility].   Natalie LOISE Sayers, NP   Subjective:   [name] is a 38 y.o. (DOB January 06, 1986) female.  Chief Complaint: No chief complaint on file.   HPI Natalie [name] presents for follow-up of MDD, GAD, mixed obsessional thoughts and acts and ADHD.  Describes mood today as ok. Pleasant. Reports tearfulness. Mood symptoms - denies depression, anxiety and irritability. Reports varying interest and motivation. Denies recent panic attacks. Denies worry, rumination, and over thinking. Reports some skin picking - not as bad. Reports mood is stable. Stating I feel like I'm doing ok. Taking medications as prescribed. Working with Donzell Rend - therapist.  Energy levels lower, but ok. Active, does not have a regular exercise routine.   Enjoys some usual interests and activities. Married. Lives with husband and son. Family local. Appetite adequate. Weight stable - 155 to 160 pounds. Sleeps well most nights. Averages 8 to 10 hours.  Focus and  concentration stable with Adderall . Completing tasks. Managing aspects of household. Working full time as a Horticulturist, commercial. Denies SI or HI.  Denies AH or VH. Denies self harm. Denies substance use.  Previous medication trials: Effexor  37.5mg     Review of Systems:  Review of Systems  Musculoskeletal:  Negative for gait problem.  Neurological:  Negative for tremors.  Psychiatric/Behavioral:         Please refer to HPI    Medications: I have reviewed the patient's current medications.  Current Outpatient Medications  Medication Sig Dispense Refill   amphetamine -dextroamphetamine  (ADDERALL  XR) 30 MG 24 hr capsule Take 1 capsule (30 mg total) by mouth daily. 30 capsule 0   amphetamine -dextroamphetamine  (ADDERALL  XR) 30 MG 24 hr capsule Take 1 capsule (30 mg total) by mouth daily. 30 capsule 0   amphetamine -dextroamphetamine  (ADDERALL  XR) 30 MG 24 hr capsule Take 1 capsule (30 mg total) by mouth daily. 8 capsule 0   busPIRone  (BUSPAR ) 30 MG tablet TAKE 1 TABLET BY MOUTH EVERY DAY 30 tablet 0   cyclobenzaprine  (FLEXERIL ) 10 MG tablet Take 10 mg by mouth 3 (three) times daily as needed. (Patient not taking: Reported on 05/28/2022)     diclofenac  (VOLTAREN ) 75 MG EC tablet Take 1 tablet (75 mg total) by mouth 2 (two) times daily as needed for moderate pain. (Patient not taking: Reported on 05/28/2022) 30 tablet 0   gabapentin  (NEURONTIN ) 100 MG capsule Take 1 to 3 capsules by mouth at bedtime for back pain. 90 capsule 0   levonorgestrel (MIRENA) 20 MCG/24HR IUD by Intrauterine route.  meloxicam (MOBIC) 15 MG tablet Take 15 mg by mouth daily.     methylPREDNISolone  (MEDROL  DOSEPAK) 4 MG TBPK tablet TAKE 6 TABLETS ON DAY 1 AS DIRECTED ON PACKAGE AND DECREASE BY 1 TAB EACH DAY FOR A TOTAL OF 6 DAYS     venlafaxine  XR (EFFEXOR -XR) 75 MG 24 hr capsule TAKE ONE CAPSULE BY MOUTH ONE TIME DAILY WITH BREAKFAST 30 capsule 0   No current facility-administered medications for this visit.    Medication  Side Effects: None  Allergies: No Known Allergies  Past Medical History:  Diagnosis Date   Chronic back pain     Family History  Problem Relation Age of Onset   Cancer Maternal Aunt        breast   Hypertension Paternal Grandmother    Diabetes Paternal Grandfather     Social History   Socioeconomic History   Marital status: Married    Spouse name: Not on file   Number of children: Not on file   Years of education: Not on file   Highest education level: Not on file  Occupational History   Not on file  Tobacco Use   Smoking status: Never   Smokeless tobacco: Never  Substance and Sexual Activity   Alcohol use: Yes    Comment: occassionally   Drug use: No   Sexual activity: Not on file  Other Topics Concern   Not on file  Social History Narrative   Married.   1 child.   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   Social Drivers of Corporate investment banker Strain: Not on file  Food Insecurity: Not on file  Transportation Needs: Not on file  Physical Activity: Not on file  Stress: Not on file  Social Connections: Not on file  Intimate Partner Violence: Not on file    Past Medical History, Surgical history, Social history, and Family history were reviewed and updated as appropriate.   Please see review of systems for further details on the patient's review from today.   Objective:   Physical Exam:  There were no vitals taken for this visit.  Physical Exam Constitutional:      General: She is not in acute distress. Musculoskeletal:        General: No deformity.  Neurological:     Mental Status: She is alert and oriented to person, place, and time.     Coordination: Coordination normal.  Psychiatric:        Attention and Perception: Attention and perception normal. She does not perceive auditory or visual hallucinations.        Mood and Affect: Mood normal. Mood is not anxious or depressed. Affect is  not labile, blunt, angry or inappropriate.        Speech: Speech normal.        Behavior: Behavior normal.        Thought Content: Thought content normal. Thought content is not paranoid or delusional. Thought content does not include homicidal or suicidal ideation. Thought content does not include homicidal or suicidal plan.        Cognition and Memory: Cognition and memory normal.        Judgment: Judgment normal.     Comments: Insight intact     Lab Review:     Component Value Date/Time   NA 140 05/25/2022 1848   K 3.8 05/25/2022 1848   CL 107 05/25/2022 1848   CO2 24  05/25/2022 1848   GLUCOSE 97 05/25/2022 1848   BUN 18 05/25/2022 1848   CREATININE 0.85 05/25/2022 1848   CREATININE 0.85 07/04/2021 1605   CALCIUM 9.7 05/25/2022 1848   PROT 6.9 07/04/2021 1605   ALBUMIN 4.3 09/30/2020 1542   AST 21 07/04/2021 1605   ALT 21 07/04/2021 1605   ALKPHOS 61 09/30/2020 1542   BILITOT 0.4 07/04/2021 1605   GFRNONAA >60 05/25/2022 1848       Component Value Date/Time   WBC 9.8 05/25/2022 1848   RBC 4.71 05/25/2022 1848   HGB 14.6 05/25/2022 1848   HCT 42.5 05/25/2022 1848   PLT 246 05/25/2022 1848   MCV 90.2 05/25/2022 1848   MCH 31.0 05/25/2022 1848   MCHC 34.4 05/25/2022 1848   RDW 12.1 05/25/2022 1848   LYMPHSABS 1.4 05/25/2022 1848   MONOABS 0.3 05/25/2022 1848   EOSABS 0.1 05/25/2022 1848   BASOSABS 0.0 05/25/2022 1848    No results found for: POCLITH, LITHIUM   No results found for: PHENYTOIN, PHENOBARB, VALPROATE, CBMZ   .res Assessment: Plan:   Plan:  PDMP reviewed  Adderall  XR 30mg  every morning. Effexor  75mg  daily  Buspar  30mg  daily for anxiety  RTC 4 weeks  Monitor BP between visits while taking stimulant medication.   25 minutes spent dedicated to the care of this patient on the date of this encounter to include pre-visit review of records, ordering of medication, post visit documentation, and face-to-face time with the patient  discussing MDD, GAD, mixed obsessional thoughts and acts and ADHD. Discussed continuing current medication regimen.  Patient advised to contact office with any questions, adverse effects, or acute worsening in signs and symptoms.  Discussed potential benefits, risks, and side effects of stimulants with patient to include increased heart rate, palpitations, insomnia, increased anxiety, increased irritability, or decreased appetite. Instructed patient to contact office if experiencing any significant tolerability issues. There are no diagnoses linked to this encounter.   Please see After Visit Summary for patient specific instructions.  Future Appointments  Date Time Provider Department Center  04/12/2024  8:30 AM Adelayde Minney Nattalie, NP [facility] None    No orders of the defined types were placed in this encounter.     -------------------------------

## 2024-05-18 ENCOUNTER — Other Ambulatory Visit: Payer: Self-pay | Admitting: Primary Care

## 2024-05-18 ENCOUNTER — Ambulatory Visit (INDEPENDENT_AMBULATORY_CARE_PROVIDER_SITE_OTHER): Payer: Self-pay | Admitting: Primary Care

## 2024-05-18 ENCOUNTER — Encounter: Payer: Self-pay | Admitting: Primary Care

## 2024-05-18 ENCOUNTER — Ambulatory Visit: Payer: Self-pay | Admitting: Primary Care

## 2024-05-18 VITALS — BP 122/66 | HR 81 | Temp 97.3°F | Ht 64.0 in | Wt 173.0 lb

## 2024-05-18 DIAGNOSIS — E785 Hyperlipidemia, unspecified: Secondary | ICD-10-CM

## 2024-05-18 DIAGNOSIS — Z23 Encounter for immunization: Secondary | ICD-10-CM

## 2024-05-18 DIAGNOSIS — Z0000 Encounter for general adult medical examination without abnormal findings: Secondary | ICD-10-CM

## 2024-05-18 DIAGNOSIS — F419 Anxiety disorder, unspecified: Secondary | ICD-10-CM

## 2024-05-18 DIAGNOSIS — R7989 Other specified abnormal findings of blood chemistry: Secondary | ICD-10-CM

## 2024-05-18 DIAGNOSIS — F32A Depression, unspecified: Secondary | ICD-10-CM

## 2024-05-18 DIAGNOSIS — F988 Other specified behavioral and emotional disorders with onset usually occurring in childhood and adolescence: Secondary | ICD-10-CM

## 2024-05-18 LAB — LIPID PANEL
Cholesterol: 212 mg/dL — ABNORMAL HIGH (ref 0–200)
HDL: 70.4 mg/dL (ref >39.00)
LDL Cholesterol: 131 mg/dL — ABNORMAL HIGH (ref 0–99)
NonHDL: 141.26
Total CHOL/HDL Ratio: 3
Triglycerides: 52 mg/dL (ref 0.0–149.0)
VLDL: 10.4 mg/dL (ref 0.0–40.0)

## 2024-05-18 LAB — COMPREHENSIVE METABOLIC PANEL WITH GFR
ALT: 31 U/L (ref 0–35)
AST: 27 U/L (ref 0–37)
Albumin: 4.2 g/dL (ref 3.5–5.2)
Alkaline Phosphatase: 65 U/L (ref 39–117)
BUN: 15 mg/dL (ref 6–23)
CO2: 28 meq/L (ref 19–32)
Calcium: 9.1 mg/dL (ref 8.4–10.5)
Chloride: 104 meq/L (ref 96–112)
Creatinine, Ser: 0.69 mg/dL (ref 0.40–1.20)
GFR: 108.66 mL/min (ref >60.00)
Glucose, Bld: 86 mg/dL (ref 70–99)
Potassium: 4.3 meq/L (ref 3.5–5.1)
Sodium: 142 meq/L (ref 135–145)
Total Bilirubin: 0.4 mg/dL (ref 0.2–1.2)
Total Protein: 6.7 g/dL (ref 6.0–8.3)

## 2024-05-18 LAB — CBC
HCT: 39.2 % (ref 36.0–46.0)
Hemoglobin: 13.2 g/dL (ref 12.0–15.0)
MCHC: 33.6 g/dL (ref 30.0–36.0)
MCV: 92.7 fl (ref 78.0–100.0)
Platelets: 234 K/uL (ref 150.0–400.0)
RBC: 4.23 Mil/uL (ref 3.87–5.11)
RDW: 14.6 % (ref 11.5–15.5)
WBC: 3.9 K/uL — ABNORMAL LOW (ref 4.0–10.5)

## 2024-05-18 NOTE — Assessment & Plan Note (Signed)
 Controlled.  Continue BuSpar  30 mg daily, venlafaxine  ER 75 mg daily. Follow with psychiatry.

## 2024-05-18 NOTE — Assessment & Plan Note (Signed)
 Controlled.  Continue Adderall  XR 30 mg daily. Following with psychiatry.

## 2024-05-18 NOTE — Assessment & Plan Note (Signed)
 Tdap provided today. Pap smear UTD.  Follows with GYN Mammogram due, she will have this done at her GYN office  Discussed the importance of a healthy diet and regular exercise in order for weight loss, and to reduce the risk of further co-morbidity.  Exam stable. Labs pending.  Follow up in 1 year for repeat physical.

## 2024-05-18 NOTE — Progress Notes (Signed)
 Subjective:    Patient ID: Natalie [name], female    DOB: 1986/02/18, 38 y.o.   MRN: [id number]  HPI  DAIANNA [name] is a very pleasant 38 y.o. female who presents today for complete physical and follow up of chronic conditions.  Immunizations: -Tetanus: Completed in 2015  Diet: Fair diet.  Exercise: No regular exercise.  Eye exam: Completes annually  Dental exam: Completes semi-annually    Pap Smear: Completes per GYN.  Mammogram: Never completed, she will schedule with GYN.  Wt Readings from Last 3 Encounters:  05/18/24 173 lb (78.5 kg)  05/28/22 155 lb (70.3 kg)  05/25/22 160 lb (72.6 kg)        Review of Systems  Constitutional:  Negative for unexpected weight change.  HENT:  Negative for rhinorrhea.   Respiratory:  Negative for cough and shortness of breath.   Cardiovascular:  Negative for chest pain.  Gastrointestinal:  Negative for constipation and diarrhea.  Genitourinary:  Negative for difficulty urinating and menstrual problem.  Musculoskeletal:  Negative for arthralgias and myalgias.  Skin:  Negative for rash.  Allergic/Immunologic: Negative for environmental allergies.  Neurological:  Negative for dizziness, numbness and headaches.  Psychiatric/Behavioral:  The patient is not nervous/anxious.          Past Medical History:  Diagnosis Date   Chronic back pain    History of miscarriage 09/17/2014    Social History   Socioeconomic History   Marital status: Married    Spouse name: Not on file   Number of children: Not on file   Years of education: Not on file   Highest education level: Not on file  Occupational History   Not on file  Tobacco Use   Smoking status: Never   Smokeless tobacco: Never  Substance and Sexual Activity   Alcohol use: Yes    Comment: occassionally   Drug use: No   Sexual activity: Not on file  Other Topics Concern   Not on file  Social History Narrative   Married.   1 child.   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   Social Drivers of Corporate investment banker Strain: Not on file  Food Insecurity: Not on file  Transportation Needs: Not on file  Physical Activity: Not on file  Stress: Not on file  Social Connections: Not on file  Intimate Partner Violence: Not on file    Past Surgical History:  Procedure Laterality Date   BACK SURGERY     DILATION AND EVACUATION N/A 04/30/2014   Procedure: DILATATION AND EVACUATION;  Surgeon: Donna Just, DO;  Location: [facility];  Service: Gynecology;  Laterality: N/A;   DILATION AND EVACUATION N/A 10/24/2014   Procedure: DILATATION AND EVACUATION with genetic studies;  Surgeon: Duwaine Blumenthal, DO;  Location: [facility];  Service: Gynecology;  Laterality: N/A;  genetic studies    Family History  Problem Relation Age of Onset   Cancer Maternal Aunt        breast   Hypertension Paternal Grandmother    Diabetes Paternal Grandfather     Allergies  Allergen Reactions   Pineapple Nausea And Vomiting    Current Outpatient Medications on File Prior to Visit  Medication Sig Dispense Refill   amphetamine -dextroamphetamine  (ADDERALL  XR) 30 MG 24 hr capsule Take 1 capsule (30 mg total) by mouth daily. 30 capsule 0   amphetamine -dextroamphetamine  (ADDERALL  XR) 30 MG 24 hr capsule Take 1 capsule (  30 mg total) by mouth daily. 30 capsule 0   [START ON 06/07/2024] amphetamine -dextroamphetamine  (ADDERALL  XR) 30 MG 24 hr capsule Take 1 capsule (30 mg total) by mouth daily. 8 capsule 0   busPIRone  (BUSPAR ) 30 MG tablet Take 1 tablet (30 mg total) by mouth daily. 30 tablet 2   levonorgestrel (MIRENA) 20 MCG/24HR IUD by Intrauterine route.     venlafaxine  XR (EFFEXOR -XR) 75 MG 24 hr capsule TAKE ONE CAPSULE BY MOUTH ONE TIME DAILY WITH BREAKFAST 30 capsule 2   No current facility-administered medications on file prior to visit.    BP 122/66   Pulse 81   Temp (!) 97.3 F (36.3 C) (Temporal)   Ht 5' 4 (1.626 m)   Wt  173 lb (78.5 kg)   LMP 05/09/2024   SpO2 98%   Breastfeeding No   BMI 29.70 kg/m  Objective:   Physical Exam HENT:     Right Ear: Tympanic membrane and ear canal normal.     Left Ear: Tympanic membrane and ear canal normal.  Eyes:     Pupils: Pupils are equal, round, and reactive to light.  Cardiovascular:     Rate and Rhythm: Normal rate and regular rhythm.  Pulmonary:     Effort: Pulmonary effort is normal.     Breath sounds: Normal breath sounds.  Abdominal:     General: Bowel sounds are normal.     Palpations: Abdomen is soft.     Tenderness: There is no abdominal tenderness.  Musculoskeletal:        General: Normal range of motion.     Cervical back: Neck supple.  Skin:    General: Skin is warm and dry.  Neurological:     Mental Status: She is alert and oriented to person, place, and time.     Cranial Nerves: No cranial nerve deficit.     Deep Tendon Reflexes:     Reflex Scores:      Patellar reflexes are 2+ on the right side and 2+ on the left side. Psychiatric:        Mood and Affect: Mood normal.           Assessment & Plan:  Preventative health care Assessment & Plan: Tdap provided today. Pap smear UTD.  Follows with GYN Mammogram due, she will have this done at her GYN office  Discussed the importance of a healthy diet and regular exercise in order for weight loss, and to reduce the risk of further co-morbidity.  Exam stable. Labs pending.  Follow up in 1 year for repeat physical.    Attention deficit disorder, unspecified type Assessment & Plan: Controlled.  Continue Adderall  XR 30 mg daily. Following with psychiatry.   Anxiety and depression Assessment & Plan: Controlled.  Continue BuSpar  30 mg daily, venlafaxine  ER 75 mg daily. Follow with psychiatry.   Hyperlipidemia, unspecified hyperlipidemia type Assessment & Plan: Repeat lipid panel pending.  Orders: -     Lipid panel -     Comprehensive metabolic panel with GFR -      CBC        Comer MARLA Gaskins, NP

## 2024-05-18 NOTE — Patient Instructions (Signed)
 Stop by the lab prior to leaving today. I will notify you of your results once received.   Schedule your mammogram as discussed.  It was a pleasure to see you today!

## 2024-05-18 NOTE — Assessment & Plan Note (Signed)
 Repeat lipid panel pending.

## 2024-05-19 ENCOUNTER — Other Ambulatory Visit: Payer: Self-pay

## 2024-05-19 DIAGNOSIS — R7989 Other specified abnormal findings of blood chemistry: Secondary | ICD-10-CM

## 2024-05-22 LAB — TIQ-MISC

## 2024-05-22 LAB — PERIPHERAL BLOOD SMEAR REVIEW

## 2024-05-23 ENCOUNTER — Ambulatory Visit: Payer: Self-pay | Admitting: Primary Care

## 2024-06-02 ENCOUNTER — Ambulatory Visit (INDEPENDENT_AMBULATORY_CARE_PROVIDER_SITE_OTHER): Admitting: Adult Health

## 2024-06-02 ENCOUNTER — Encounter: Payer: Self-pay | Admitting: Adult Health

## 2024-06-02 DIAGNOSIS — F331 Major depressive disorder, recurrent, moderate: Secondary | ICD-10-CM | POA: Diagnosis not present

## 2024-06-02 DIAGNOSIS — F422 Mixed obsessional thoughts and acts: Secondary | ICD-10-CM

## 2024-06-02 DIAGNOSIS — F909 Attention-deficit hyperactivity disorder, unspecified type: Secondary | ICD-10-CM

## 2024-06-02 DIAGNOSIS — F411 Generalized anxiety disorder: Secondary | ICD-10-CM

## 2024-06-02 MED ORDER — BUSPIRONE HCL 30 MG PO TABS: 30.0000 mg | ORAL_TABLET | Freq: Every day | ORAL | 2 refills | Status: DC

## 2024-06-02 MED ORDER — AMPHETAMINE-DEXTROAMPHET ER 30 MG PO CP24: 30.0000 mg | ORAL_CAPSULE | Freq: Every day | ORAL | 0 refills | Status: DC

## 2024-06-02 MED ORDER — ALPRAZOLAM 0.25 MG PO TABS: 0.2500 mg | ORAL_TABLET | Freq: Two times a day (BID) | ORAL | 2 refills | Status: DC | PRN

## 2024-06-02 MED ORDER — VENLAFAXINE HCL ER 150 MG PO CP24: ORAL_CAPSULE | ORAL | 2 refills | Status: DC

## 2024-06-02 NOTE — Progress Notes (Signed)
 Natalie [name] [id number] [birth year] 38 y.o.  Subjective:   [name] is a 38 y.o. (DOB 07/18/1986) female.  Chief Complaint: No chief complaint on file.   HPI Natalie [name] presents to the office today for follow-up of MDD, GAD, mixed obsessional thoughts and acts and ADHD.  Describes mood today as not too good. Pleasant. Reports tearfulness. Mood symptoms - reports denies depression, anxiety and irritability. Reports decreased interest and motivation. Reports panic attacks. Reports worry, rumination and over thinking. Reports some skin picking - it's bad. Reports mood is lower Stating I haven't been doing too good. Taking medications as prescribed. Working with Donzell Rend - therapist.  Energy levels lower. Active, does not have a regular exercise routine.   Enjoys some usual interests and activities. Married. Lives with husband and son. Family local. Appetite adequate. Weight gain - 168 pounds. Sleeps is variable - up and down over the summer. Focus and concentration stable with Adderall . Completing tasks. Managing aspects of household. 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 Denies SI or HI.  Denies AH or VH. Denies self harm. Denies substance use.  Previous medication trials: Effexor  37.5mg     GAD-7    Flowsheet Row Office Visit from 05/10/2018 in [facility]  Total GAD-7 Score 21   PHQ2-9    Flowsheet Row Office Visit from 05/18/2024 in [facility] Visit from 07/04/2021 in [facility] Visit from 09/30/2020 in [facility] Office Visit from 05/10/2018 in [facility] at [employer]  PHQ-2 Total Score 1 0 0 6  PHQ-9 Total Score 1 0 0 25   Flowsheet Row ED from 05/25/2022 in [facility] at [facility] Most recent reading at 05/25/2022 10:24 PM ED from  05/25/2022 in [facility] Department at [facility] Most recent reading at 05/25/2022  6:25 PM  C-SSRS RISK CATEGORY No Risk No Risk     Review of Systems:  Review of Systems  Musculoskeletal:  Negative for gait problem.  Neurological:  Negative for tremors.  Psychiatric/Behavioral:         Please refer to HPI    Medications: I have reviewed the patient's current medications.  Current Outpatient Medications  Medication Sig Dispense Refill   amphetamine -dextroamphetamine  (ADDERALL  XR) 30 MG 24 hr capsule Take 1 capsule (30 mg total) by mouth daily. 30 capsule 0   amphetamine -dextroamphetamine  (ADDERALL  XR) 30 MG 24 hr capsule Take 1 capsule (30 mg total) by mouth daily. 30 capsule 0   [START ON 06/07/2024] amphetamine -dextroamphetamine  (ADDERALL  XR) 30 MG 24 hr capsule Take 1 capsule (30 mg total) by mouth daily. 8 capsule 0   busPIRone  (BUSPAR ) 30 MG tablet Take 1 tablet (30 mg total) by mouth daily. 30 tablet 2   levonorgestrel (MIRENA) 20 MCG/24HR IUD by Intrauterine route.     venlafaxine  XR (EFFEXOR -XR) 75 MG 24 hr capsule TAKE ONE CAPSULE BY MOUTH ONE TIME DAILY WITH BREAKFAST 30 capsule 2   No current facility-administered medications for this visit.    Medication Side Effects: None  Allergies:  Allergies  Allergen Reactions   Pineapple Nausea And Vomiting    Past Medical History:  Diagnosis Date   Chronic back pain    History of miscarriage 09/17/2014    Past Medical History, Surgical history, Social history, and Family history were reviewed and updated  as appropriate.   Please see review of systems for further details on the patient's review from today.   Objective:   Physical Exam:  LMP 05/09/2024   Physical Exam Constitutional:      General: She is not in acute distress. Musculoskeletal:        General: No deformity.  Neurological:     Mental Status: She is alert and oriented to person, place, and time.     Coordination: Coordination  normal.  Psychiatric:        Attention and Perception: Attention and perception normal. She does not perceive auditory or visual hallucinations.        Mood and Affect: Mood normal. Mood is not anxious or depressed. Affect is not labile, blunt, angry or inappropriate.        Speech: Speech normal.        Behavior: Behavior normal.        Thought Content: Thought content normal. Thought content is not paranoid or delusional. Thought content does not include homicidal or suicidal ideation. Thought content does not include homicidal or suicidal plan.        Cognition and Memory: Cognition and memory normal.        Judgment: Judgment normal.     Comments: Insight intact     Lab Review:     Component Value Date/Time   NA 142 05/18/2024 0914   K 4.3 05/18/2024 0914   CL 104 05/18/2024 0914   CO2 28 05/18/2024 0914   GLUCOSE 86 05/18/2024 0914   BUN 15 05/18/2024 0914   CREATININE 0.69 05/18/2024 0914   CREATININE 0.85 07/04/2021 1605   CALCIUM 9.1 05/18/2024 0914   PROT 6.7 05/18/2024 0914   ALBUMIN 4.2 05/18/2024 0914   AST 27 05/18/2024 0914   ALT 31 05/18/2024 0914   ALKPHOS 65 05/18/2024 0914   BILITOT 0.4 05/18/2024 0914   GFRNONAA >60 05/25/2022 1848       Component Value Date/Time   WBC 3.9 (L) 05/18/2024 0914   RBC 4.23 05/18/2024 0914   HGB 13.2 05/18/2024 0914   HCT 39.2 05/18/2024 0914   PLT 234.0 05/18/2024 0914   MCV 92.7 05/18/2024 0914   MCH 31.0 05/25/2022 1848   MCHC 33.6 05/18/2024 0914   RDW 14.6 05/18/2024 0914   LYMPHSABS 1.4 05/25/2022 1848   MONOABS 0.3 05/25/2022 1848   EOSABS 0.1 05/25/2022 1848   BASOSABS 0.0 05/25/2022 1848    No results found for: POCLITH, LITHIUM   No results found for: PHENYTOIN, PHENOBARB, VALPROATE, CBMZ   .res Assessment: Plan:    Plan:  PDMP reviewed  Add Xanax  0.25mg  BID Increase Effexor  XR 75mg  to 150mg  daily   Adderall  XR 30mg  every morning. Buspar  30mg  daily for anxiety  RTC 4  weeks  Monitor BP between visits while taking stimulant medication.   25 minutes spent dedicated to the care of this patient on the date of this encounter to include pre-visit review of records, ordering of medication, post visit documentation, and face-to-face time with the patient discussing MDD, GAD, mixed obsessional thoughts and acts and ADHD. Discussed continuing current medication regimen.  Patient advised to contact office with any questions, adverse effects, or acute worsening in signs and symptoms.  Discussed potential benefits, risks, and side effects of stimulants with patient to include increased heart rate, palpitations, insomnia, increased anxiety, increased irritability, or decreased appetite. Instructed patient to contact office if experiencing any significant tolerability issues.   There are no diagnoses linked to  this encounter.   Please see After Visit Summary for patient specific instructions.  No future appointments.  No orders of the defined types were placed in this encounter.   -------------------------------

## 2024-06-22 ENCOUNTER — Ambulatory Visit: Payer: Self-pay

## 2024-06-22 ENCOUNTER — Ambulatory Visit
Admission: EM | Admit: 2024-06-22 | Discharge: 2024-06-22 | Disposition: A | Discharge status: Home / Self Care | Attending: Emergency Medicine | Admitting: Emergency Medicine

## 2024-06-22 DIAGNOSIS — L03213 Periorbital cellulitis: Secondary | ICD-10-CM

## 2024-06-22 MED ORDER — AMOXICILLIN-POT CLAVULANATE 875-125 MG PO TABS: 1.0000 | ORAL_TABLET | Freq: Two times a day (BID) | ORAL | 0 refills | Status: DC

## 2024-06-22 NOTE — ED Provider Notes (Signed)
 Natalie [name]    CSN: [id number] Arrival date & time: 06/22/24  1603      History   Chief Complaint Chief Complaint  Patient presents with   Eye Problem    HPI Natalie [name] is a 38 y.o. female.  Patient presents with 1 day history of swelling and discomfort around her right eye.  She has a headache across her forehead also.  She treated the eye symptoms with a cool compress and the swelling has gone down.  No eye trauma, eye drainage, change in vision, pain within her eye, fever, chills, earache, sore throat, cough.  The history is provided by the patient and medical records.    Past Medical History:  Diagnosis Date   Chronic back pain    History of miscarriage 09/17/2014    Patient Active Problem List   Diagnosis Date Noted   ADD (attention deficit disorder) 09/30/2020   Anxiety and depression 05/10/2018   Preventative health care 02/11/2018   Hyperlipidemia 02/11/2018   Chronic back pain 11/04/2017    Past Surgical History:  Procedure Laterality Date   BACK SURGERY     DILATION AND EVACUATION N/A 04/30/2014   Procedure: DILATATION AND EVACUATION;  Surgeon: Donna Just, DO;  Location: [facility];  Service: Gynecology;  Laterality: N/A;   DILATION AND EVACUATION N/A 10/24/2014   Procedure: DILATATION AND EVACUATION with genetic studies;  Surgeon: Duwaine Blumenthal, DO;  Location: [facility];  Service: Gynecology;  Laterality: N/A;  genetic studies    OB History     Gravida  4   Para  1   Term      Preterm      AB  2   Living  1      SAB  2   IAB      Ectopic      Multiple      Live Births  1            Home Medications    Prior to Admission medications   Medication Sig Start Date End Date Taking? Authorizing Provider  amoxicillin -clavulanate (AUGMENTIN ) 875-125 MG tablet Take 1 tablet by mouth every 12 (twelve) hours. 06/22/24  Yes Corlis Burnard DEL, NP  ALPRAZolam  (XANAX ) 0.25 MG tablet Take 1 tablet (0.25 mg total) by mouth 2 (two) times  daily as needed for anxiety. 06/02/24   Mozingo, Regina Nattalie, NP  amphetamine -dextroamphetamine  (ADDERALL  XR) 30 MG 24 hr capsule Take 1 capsule (30 mg total) by mouth daily. 06/02/24   Mozingo, Regina Nattalie, NP  amphetamine -dextroamphetamine  (ADDERALL  XR) 30 MG 24 hr capsule Take 1 capsule (30 mg total) by mouth daily. 06/30/24   Mozingo, Regina Nattalie, NP  amphetamine -dextroamphetamine  (ADDERALL  XR) 30 MG 24 hr capsule Take 1 capsule (30 mg total) by mouth daily. 07/28/24   Mozingo, Regina Nattalie, NP  busPIRone  (BUSPAR ) 30 MG tablet Take 1 tablet (30 mg total) by mouth daily. 06/02/24   Mozingo, Regina Nattalie, NP  levonorgestrel (MIRENA) 20 MCG/24HR IUD by Intrauterine route. 06/17/16   [provider]  venlafaxine  XR (EFFEXOR -XR) 150 MG 24 hr capsule TAKE ONE CAPSULE BY MOUTH ONE TIME DAILY WITH BREAKFAST 06/02/24   Mozingo, Regina Nattalie, NP    Family History Family History  Problem Relation Age of Onset   Cancer Maternal Aunt        breast   Hypertension Paternal Grandmother    Diabetes Paternal Grandfather     Social History Social History   Tobacco Use  Smoking status: Never   Smokeless tobacco: Never  Substance Use Topics   Alcohol use: Yes    Comment: occassionally   Drug use: No     Allergies   Pineapple   Review of Systems Review of Systems  Constitutional:  Negative for chills and fever.  HENT:  Positive for facial swelling. Negative for ear pain, sore throat and trouble swallowing.   Eyes:  Negative for pain, discharge, redness, itching and visual disturbance.  Respiratory:  Negative for cough and shortness of breath.      Physical Exam Triage Vital Signs ED Triage Vitals  Encounter Vitals Group     BP 06/22/24 1626 118/80     Girls Systolic BP Percentile --      Girls Diastolic BP Percentile --      Boys Systolic BP Percentile --      Boys Diastolic BP Percentile --      Pulse Rate 06/22/24 1626 100     Resp 06/22/24 1626 18      Temp 06/22/24 1626 98 F (36.7 C)     Temp src --      SpO2 06/22/24 1626 98 %     Weight --      Height --      Head Circumference --      Peak Flow --      Pain Score 06/22/24 1622 3     Pain Loc --      Pain Education --      Exclude from Growth Chart --    No data found.  Updated Vital Signs BP 118/80   Pulse 100   Temp 98 F (36.7 C)   Resp 18   SpO2 98%   Visual Acuity Right Eye Distance: 20/30 Left Eye Distance: 20/20 Bilateral Distance: 20/20  Right Eye Near:   Left Eye Near:    Bilateral Near:     Physical Exam Constitutional:      General: She is not in acute distress. HENT:     Right Ear: Tympanic membrane normal.     Left Ear: Tympanic membrane normal.     Nose: Nose normal.     Mouth/Throat:     Mouth: Mucous membranes are moist.     Pharynx: Oropharynx is clear.  Eyes:     General: Vision grossly intact.        Right eye: No discharge.        Left eye: No discharge.     Extraocular Movements: Extraocular movements intact.     Conjunctiva/sclera: Conjunctivae normal.     Pupils: Pupils are equal, round, and reactive to light.     Comments: Generalized edema and mild erythema of the right upper and lower eyelids extending to just above the right eyebrow and down to the right upper cheek.  Cardiovascular:     Rate and Rhythm: Normal rate and regular rhythm.     Heart sounds: Normal heart sounds.  Pulmonary:     Effort: Pulmonary effort is normal. No respiratory distress.     Breath sounds: Normal breath sounds.  Neurological:     Mental Status: She is alert.      UC Treatments / Results  Labs (all labs ordered are listed, but only abnormal results are displayed) Labs Reviewed - No data to display  EKG   Radiology No results found.  Procedures Procedures (including critical care time)  Medications Ordered in UC Medications - No data to display  Initial Impression /  Assessment and Plan / UC Course  I have reviewed the triage  vital signs and the nursing notes.  Pertinent labs & imaging results that were available during my care of the patient were reviewed by me and considered in my medical decision making (see chart for details).    Preseptal cellulitis of right eye.  Afebrile and vital signs are stable.  No trauma.  Patient declines transfer to the ED.  Treating today with Augmentin .  Education provided on preseptal cellulitis.  Instructed patient to follow-up with her PCP tomorrow.  Strict ED precautions discussed.  She agrees to plan of care.  Final Clinical Impressions(s) / UC Diagnoses   Final diagnoses:  Preseptal cellulitis of right eye     Discharge Instructions      Follow up with your primary care provider tomorrow.  Go to the emergency department if you have worsening symptoms.    Take the Augmentin  as directed.       ED Prescriptions     Medication Sig Dispense Auth. Provider   amoxicillin -clavulanate (AUGMENTIN ) 875-125 MG tablet Take 1 tablet by mouth every 12 (twelve) hours. 14 tablet Corlis Burnard DEL, NP      PDMP not reviewed this encounter.   Corlis Burnard DEL, NP 06/22/24 [phone]

## 2024-06-22 NOTE — Telephone Encounter (Signed)
 FYI Only or Action Required?: FYI only for provider.  Patient was last seen in primary care on 05/18/2024 by Gretta Comer POUR, NP.  Called Nurse Triage reporting Headache and Facial Swelling.  Symptoms began yesterday.  Interventions attempted: Ice/heat application.  Symptoms are: right facial swelling to forehead/eyebrow, under eye and temple that is painful to touch, mild dizziness when standing up today, right upper eyelid droopy and itchiness stable.  Triage Disposition: See Physician Within 24 Hours  Patient/caregiver understands and will follow disposition?: Yes            Copied from CRM [id number]. Topic: Clinical - Red Word Triage >> Jun 22, 2024  2:58 PM Thersia BROCKS wrote: [facility] that prompted transfer to Nurse Triage: Patient called in stated her eye is swollen, stated she feels like its the vein around her eye, got a headache around lunchtime Reason for Disposition  [1] MODERATE headache (e.g., interferes with normal activities) AND [2] present > 24 hours AND [3] unexplained  (Exceptions: Pain medicines not tried, typical migraine, or headache part of viral illness.)  Answer Assessment - Initial Assessment Questions 1. LOCATION: Where does it hurt?      Across forehead and above eyebrow on right side.  2. ONSET: When did the headache start? (e.g., minutes, hours, days)      Yesterday.  3. PATTERN: Does the pain come and go, or has it been constant since it started?     Has had episodes over the past couple of months; constant since yesterday and worsening this morning but has stabilized today.  4. SEVERITY: How bad is the pain? and What does it keep you from doing?  (e.g., Scale 1-10; mild, moderate, or severe)     Tender to touch, 4/10.  5. RECURRENT SYMPTOM: Have you ever had headaches before? If Yes, ask: When was the last time? and What happened that time?      She states usually she only gets a headache rarely and just before her period  starts.  6. CAUSE: What do you think is causing the headache?     She states this is the third time this has happened, the first time was less noticeable and states it felt like a vein in her forehead that was tender to touch  7. MIGRAINE: Have you been diagnosed with migraine headaches? If Yes, ask: Is this headache similar?      No.  8. HEAD INJURY: Has there been any recent injury to your head?      No.  9. OTHER SYMPTOMS: Do you have any other symptoms? (e.g., fever, stiff neck, eye pain, sore throat, cold symptoms)     Right sided facial swelling along eyebrow, temple and below right eye, right upper eyelid itchiness, right upper eyelid droopy, she states a few times today she felt mild dizzy when going from sitting to standing (reports she made sure to eat well and staying hydrated). Denies any visual changes, redness, neck stiffness, eye pain, cough, sore throat, fever, difficulty swallowing or breathing, unilateral numbness or weakness, changes in speech.  10. PREGNANCY: Is there any chance you are pregnant? When was your last menstrual period?       LMP: 06/13/24.  Protocols used: [facility]

## 2024-06-22 NOTE — ED Triage Notes (Signed)
 Patient to Urgent Care with complaints of right sided eye swelling/ puffiness/ heaviness. No drainage. Headache.   Symptoms started yesterday.   No otc meds.

## 2024-06-22 NOTE — Telephone Encounter (Signed)
 Noted

## 2024-06-22 NOTE — Discharge Instructions (Addendum)
Follow up with your primary care provider tomorrow.  Go to the emergency department if you have worsening symptoms.    Take the Augmentin as directed.

## 2024-06-27 NOTE — Telephone Encounter (Signed)
 Spoke with pt asking about lab request for infection. Pt states she was seen on 06/22/24 at [facility], dx preseptal cellulitis of R eye. States was treated with abx andeye is improving. She just wanted to see if any blood work would indicate infection is gone.   Pt scheduled lab visit on 06/28/24 at 4:15.

## 2024-06-27 NOTE — Telephone Encounter (Signed)
 Copied from CRM [id number] [id number]. Topic: General - Other >> Jun 26, 2024  4:16 PM Fonda T wrote: Reason for CRM: Patient calling, states she is calling to schedule an appointment for labs per provider request.  Per patient, prior to scheduling appointment for labs,  would like to know if there are any labs/tests that can be ordered to check for infection.  Patient can be reached for follow call at [phone].

## 2024-06-28 ENCOUNTER — Other Ambulatory Visit (INDEPENDENT_AMBULATORY_CARE_PROVIDER_SITE_OTHER)

## 2024-06-28 ENCOUNTER — Other Ambulatory Visit: Payer: Self-pay | Admitting: Primary Care

## 2024-06-28 DIAGNOSIS — R7989 Other specified abnormal findings of blood chemistry: Secondary | ICD-10-CM

## 2024-06-29 ENCOUNTER — Ambulatory Visit: Payer: Self-pay | Admitting: Primary Care

## 2024-06-29 LAB — CBC WITH DIFFERENTIAL/PLATELET
Basophils Absolute: 0 K/uL (ref 0.0–0.1)
Basophils Relative: 0.6 % (ref 0.0–3.0)
Eosinophils Absolute: 0.1 K/uL (ref 0.0–0.7)
Eosinophils Relative: 1.7 % (ref 0.0–5.0)
HCT: 37.4 % (ref 36.0–46.0)
Hemoglobin: 12.7 g/dL (ref 12.0–15.0)
Lymphocytes Relative: 30.6 % (ref 12.0–46.0)
Lymphs Abs: 1.9 K/uL (ref 0.7–4.0)
MCHC: 33.9 g/dL (ref 30.0–36.0)
MCV: 90.7 fl (ref 78.0–100.0)
Monocytes Absolute: 0.5 K/uL (ref 0.1–1.0)
Monocytes Relative: 8.4 % (ref 3.0–12.0)
Neutro Abs: 3.6 K/uL (ref 1.4–7.7)
Neutrophils Relative %: 58.7 % (ref 43.0–77.0)
Platelets: 247 K/uL (ref 150.0–400.0)
RBC: 4.12 Mil/uL (ref 3.87–5.11)
RDW: 13.2 % (ref 11.5–15.5)
WBC: 6.1 K/uL (ref 4.0–10.5)

## 2024-07-03 ENCOUNTER — Telehealth (INDEPENDENT_AMBULATORY_CARE_PROVIDER_SITE_OTHER): Admitting: Adult Health

## 2024-07-03 ENCOUNTER — Encounter: Payer: Self-pay | Admitting: Adult Health

## 2024-07-03 DIAGNOSIS — F422 Mixed obsessional thoughts and acts: Secondary | ICD-10-CM

## 2024-07-03 DIAGNOSIS — F909 Attention-deficit hyperactivity disorder, unspecified type: Secondary | ICD-10-CM

## 2024-07-03 DIAGNOSIS — F411 Generalized anxiety disorder: Secondary | ICD-10-CM | POA: Diagnosis not present

## 2024-07-03 DIAGNOSIS — F331 Major depressive disorder, recurrent, moderate: Secondary | ICD-10-CM | POA: Diagnosis not present

## 2024-07-03 NOTE — Progress Notes (Signed)
 GARNETTA FEDRICK [id number] 11/11/1985 38 y.o.  Virtual Visit via Video Note  I connected with pt @ on 07/03/24 at  4:30 PM EDT by a video enabled telemedicine application and verified that I am speaking with the correct person using two identifiers.   I discussed the limitations of evaluation and management by telemedicine and the availability of in person appointments. The patient expressed understanding and agreed to proceed.  I discussed the assessment and treatment plan with the patient. The patient was provided an opportunity to ask questions and all were answered. The patient agreed with the plan and demonstrated an understanding of the instructions.   The patient was advised to call back or seek an in-person evaluation if the symptoms worsen or if the condition fails to improve as anticipated.  I provided 25 minutes of non-face-to-face time during this encounter.  The patient was located at home.  The provider was located at [facility] : [facility].   Angeline LOISE Sayers, NP   Subjective:   Patient ID:  KIMBERLYE DILGER is a 38 y.o. (DOB 1986/04/28) female.  Chief Complaint: No chief complaint on file.   HPI VICTOIRE DEANS presents for follow-up of MDD, GAD, mixed obsessional thoughts and acts and ADHD.  Describes mood today as better. Pleasant. Denies tearfulness. Mood symptoms - reports decreased depression, anxiety and irritability - more situational. Reports lower interest and motivation. Reports one recent panic attacks. Reports worry, rumination and over thinking - not more than usual. Reports some skin picking - it's better. Reports mood is better. Stating I feel like things are better with getting back in the routine of things. Taking medications as prescribed. Working with Donzell Rend - therapist.  Energy levels a little better. Active, does not have a regular exercise routine.   Enjoys some usual interests and activities. Married. Lives with husband and son. Family  local. Appetite adequate. Weight gain - 168 to 175 pounds. Sleeps is variable - still up and down, but better Focus and concentration stable with Adderall . Completing tasks. Managing aspects of household. 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 Denies SI or HI.  Denies AH or VH. Denies self harm. Denies substance use.  Previous medication trials: Effexor  37.5mg    Review of Systems:  Review of Systems  Musculoskeletal:  Negative for gait problem.  Neurological:  Negative for tremors.  Psychiatric/Behavioral:         Please refer to HPI    Medications: I have reviewed the patient's current medications.  Current Outpatient Medications  Medication Sig Dispense Refill   ALPRAZolam  (XANAX ) 0.25 MG tablet Take 1 tablet (0.25 mg total) by mouth 2 (two) times daily as needed for anxiety. 60 tablet 2   amoxicillin -clavulanate (AUGMENTIN ) 875-125 MG tablet Take 1 tablet by mouth every 12 (twelve) hours. 14 tablet 0   amphetamine -dextroamphetamine  (ADDERALL  XR) 30 MG 24 hr capsule Take 1 capsule (30 mg total) by mouth daily. 30 capsule 0   amphetamine -dextroamphetamine  (ADDERALL  XR) 30 MG 24 hr capsule Take 1 capsule (30 mg total) by mouth daily. 30 capsule 0   [START ON 07/28/2024] amphetamine -dextroamphetamine  (ADDERALL  XR) 30 MG 24 hr capsule Take 1 capsule (30 mg total) by mouth daily. 30 capsule 0   busPIRone  (BUSPAR ) 30 MG tablet Take 1 tablet (30 mg total) by mouth daily. 30 tablet 2   levonorgestrel (MIRENA) 20 MCG/24HR IUD by Intrauterine route.     venlafaxine  XR (EFFEXOR -XR) 150 MG 24 hr capsule TAKE ONE CAPSULE BY MOUTH ONE TIME  DAILY WITH BREAKFAST 30 capsule 2   No current facility-administered medications for this visit.    Medication Side Effects: None  Allergies:  Allergies  Allergen Reactions   Pineapple Nausea And Vomiting    Past Medical History:  Diagnosis Date   Chronic back pain    History of miscarriage 09/17/2014    Family History  Problem Relation Age of  Onset   Cancer Maternal Aunt        breast   Hypertension Paternal Grandmother    Diabetes Paternal Grandfather     Social History   Socioeconomic History   Marital status: Married    Spouse name: Not on file   Number of children: Not on file   Years of education: Not on file   Highest education level: Not on file  Occupational History   Not on file  Tobacco Use   Smoking status: Never   Smokeless tobacco: Never  Substance and Sexual Activity   Alcohol use: Yes    Comment: occassionally   Drug use: No   Sexual activity: Not on file  Other Topics Concern   Not on file  Social History Narrative   Married.   1 child.   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   Social Drivers of Corporate investment banker Strain: Not on file  Food Insecurity: Not on file  Transportation Needs: Not on file  Physical Activity: Not on file  Stress: Not on file  Social Connections: Not on file  Intimate Partner Violence: Not on file    Past Medical History, Surgical history, Social history, and Family history were reviewed and updated as appropriate.   Please see review of systems for further details on the patient's review from today.   Objective:   Physical Exam:  There were no vitals taken for this visit.  Physical Exam Constitutional:      General: She is not in acute distress. Musculoskeletal:        General: No deformity.  Neurological:     Mental Status: She is alert and oriented to person, place, and time.     Coordination: Coordination normal.  Psychiatric:        Attention and Perception: Attention and perception normal. She does not perceive auditory or visual hallucinations.        Mood and Affect: Mood normal. Mood is not anxious or depressed. Affect is not labile, blunt, angry or inappropriate.        Speech: Speech normal.        Behavior: Behavior normal.        Thought Content: Thought content normal. Thought  content is not paranoid or delusional. Thought content does not include homicidal or suicidal ideation. Thought content does not include homicidal or suicidal plan.        Cognition and Memory: Cognition and memory normal.        Judgment: Judgment normal.     Comments: Insight intact     Lab Review:     Component Value Date/Time   NA 142 05/18/2024 0914   K 4.3 05/18/2024 0914   CL 104 05/18/2024 0914   CO2 28 05/18/2024 0914   GLUCOSE 86 05/18/2024 0914   BUN 15 05/18/2024 0914   CREATININE 0.69 05/18/2024 0914   CREATININE 0.85 07/04/2021 1605   CALCIUM 9.1 05/18/2024 0914   PROT 6.7 05/18/2024 0914   ALBUMIN 4.2 05/18/2024 0914   AST 27  05/18/2024 0914   ALT 31 05/18/2024 0914   ALKPHOS 65 05/18/2024 0914   BILITOT 0.4 05/18/2024 0914   GFRNONAA >60 05/25/2022 1848       Component Value Date/Time   WBC 6.1 06/28/2024 1616   RBC 4.12 06/28/2024 1616   HGB 12.7 06/28/2024 1616   HCT 37.4 06/28/2024 1616   PLT 247.0 06/28/2024 1616   MCV 90.7 06/28/2024 1616   MCH 31.0 05/25/2022 1848   MCHC 33.9 06/28/2024 1616   RDW 13.2 06/28/2024 1616   LYMPHSABS 1.9 06/28/2024 1616   MONOABS 0.5 06/28/2024 1616   EOSABS 0.1 06/28/2024 1616   BASOSABS 0.0 06/28/2024 1616    No results found for: POCLITH, LITHIUM   No results found for: PHENYTOIN, PHENOBARB, VALPROATE, CBMZ   .res Assessment: Plan:    Plan:  PDMP reviewed  Xanax  0.25mg  BID Effexor  XR 150mg  daily  Adderall  XR 30mg  every morning. Buspar  30mg  daily for anxiety  RTC 3 months  Monitor BP between visits while taking stimulant medication.   25 minutes spent dedicated to the care of this patient on the date of this encounter to include pre-visit review of records, ordering of medication, post visit documentation, and face-to-face time with the patient discussing MDD, GAD, mixed obsessional thoughts and acts and ADHD. Discussed continuing current medication regimen.  Patient advised to contact  office with any questions, adverse effects, or acute worsening in signs and symptoms.  Discussed potential benefits, risks, and side effects of stimulants with patient to include increased heart rate, palpitations, insomnia, increased anxiety, increased irritability, or decreased appetite. Instructed patient to contact office if experiencing any significant tolerability issues.   There are no diagnoses linked to this encounter.   Please see After Visit Summary for patient specific instructions.  Future Appointments  Date Time Provider Department Center  07/03/2024  4:30 PM Clement Deneault Nattalie, NP [facility] None    No orders of the defined types were placed in this encounter.     -------------------------------

## 2024-08-17 ENCOUNTER — Other Ambulatory Visit (HOSPITAL_COMMUNITY): Payer: Self-pay | Admitting: Obstetrics & Gynecology

## 2024-08-17 DIAGNOSIS — R1909 Other intra-abdominal and pelvic swelling, mass and lump: Secondary | ICD-10-CM

## 2024-08-22 ENCOUNTER — Encounter: Payer: Self-pay | Admitting: Primary Care

## 2024-08-22 ENCOUNTER — Ambulatory Visit: Admitting: Primary Care

## 2024-08-22 VITALS — BP 118/80 | HR 83 | Temp 97.8°F | Ht 64.0 in | Wt 172.0 lb

## 2024-08-22 DIAGNOSIS — J0110 Acute frontal sinusitis, unspecified: Secondary | ICD-10-CM | POA: Insufficient documentation

## 2024-08-22 MED ORDER — PREDNISONE 20 MG PO TABS: ORAL_TABLET | ORAL | 0 refills | Status: AC

## 2024-08-22 NOTE — Assessment & Plan Note (Signed)
 Most likely diagnosis given HPI. Other differentials include trigeminal neuralgia, migraine.  Continue amoxicillin  as prescribed. Start prednisone  20 mg tablets. Take 2 tablets by mouth once daily in the morning for 5 days. Hold Flonase for now.  Consider imaging if no improvement.

## 2024-08-22 NOTE — Progress Notes (Signed)
 Subjective:    Patient ID: Natalie [name], female    DOB: [birth year], 38 y.o.   MRN: [id number]  Natalie [name] is a very pleasant 38 y.o. female who presents today   Evaluated by telemedicine on 08/19/2024 for sinus pressure, rhinorrhea, sinus pressure, and bilateral headache. She was prescribed two nasal sprays, antihistamine, and Amoxil .   Since then she's continued to experience a throbbing headache to the right frontal lobe with radiation to right temporal lobe. Her left frontal sinus pressure has improved along with rhinorrhea and congestion. She does have a mild cough and photophobia. She denies fevers, chills, cough. She's taken Tylenol  with temporary improvement. She has no history of migraines.   BP Readings from Last 3 Encounters:  08/22/24 118/80  06/22/24 118/80  05/18/24 122/66      Review of Systems  Constitutional:  Negative for chills and fever.  HENT:  Positive for postnasal drip and sinus pressure. Negative for sore throat.   Eyes:  Positive for photophobia.  Respiratory:  Positive for cough.   Neurological:  Positive for headaches.         Past Medical History:  Diagnosis Date   Chronic back pain    History of miscarriage 09/17/2014    Social History   Socioeconomic History   Marital status: Married    Spouse name: Not on file   Number of children: Not on file   Years of education: Not on file   Highest education level: Not on file  Occupational History   Not on file  Tobacco Use   Smoking status: Never   Smokeless tobacco: Never  Substance and Sexual Activity   Alcohol use: Yes    Comment: occassionally   Drug use: No   Sexual activity: Not on file  Other Topics Concern   Not on file  Social History Narrative   Married.   1 child.   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   Social Drivers of Corporate Investment Banker Strain: Not on file  Food Insecurity: Not on file  Transportation  Needs: Not on file  Physical Activity: Not on file  Stress: Not on file  Social Connections: Not on file  Intimate Partner Violence: Not on file    Past Surgical History:  Procedure Laterality Date   BACK SURGERY     DILATION AND EVACUATION N/A 04/30/2014   Procedure: DILATATION AND EVACUATION;  Surgeon: Donna Just, DO;  Location: [facility];  Service: Gynecology;  Laterality: N/A;   DILATION AND EVACUATION N/A 10/24/2014   Procedure: DILATATION AND EVACUATION with genetic studies;  Surgeon: Duwaine Blumenthal, DO;  Location: [facility];  Service: Gynecology;  Laterality: N/A;  genetic studies    Family History  Problem Relation Age of Onset   Cancer Maternal Aunt        breast   Hypertension Paternal Grandmother    Diabetes Paternal Grandfather     Allergies  Allergen Reactions   Pineapple Nausea And Vomiting    Current Outpatient Medications on File Prior to Visit  Medication Sig Dispense Refill   ALPRAZolam  (XANAX ) 0.25 MG tablet Take 1 tablet (0.25 mg total) by mouth 2 (two) times daily as needed for anxiety. 60 tablet 2   amphetamine -dextroamphetamine  (ADDERALL  XR) 30 MG 24 hr capsule Take 1 capsule (30 mg total) by mouth daily. 30 capsule 0   amphetamine -dextroamphetamine  (ADDERALL  XR) 30 MG 24 hr capsule Take 1 capsule (  30 mg total) by mouth daily. 30 capsule 0   amphetamine -dextroamphetamine  (ADDERALL  XR) 30 MG 24 hr capsule Take 1 capsule (30 mg total) by mouth daily. 30 capsule 0   busPIRone  (BUSPAR ) 30 MG tablet Take 1 tablet (30 mg total) by mouth daily. 30 tablet 2   levonorgestrel (MIRENA) 20 MCG/24HR IUD by Intrauterine route.     venlafaxine  XR (EFFEXOR -XR) 150 MG 24 hr capsule TAKE ONE CAPSULE BY MOUTH ONE TIME DAILY WITH BREAKFAST 30 capsule 2   No current facility-administered medications on file prior to visit.    BP 118/80   Pulse 83   Temp 97.8 F (36.6 C) (Oral)   Ht 5' 4 (1.626 m)   Wt 172 lb (78 kg)   SpO2 99%   BMI 29.52 kg/m  Objective:   Physical  Exam HENT:     Right Ear: Tympanic membrane and ear canal normal.     Left Ear: Tympanic membrane and ear canal normal.     Nose:     Right Sinus: Maxillary sinus tenderness and frontal sinus tenderness present.     Left Sinus: No maxillary sinus tenderness or frontal sinus tenderness.  Cardiovascular:     Rate and Rhythm: Normal rate and regular rhythm.  Pulmonary:     Effort: Pulmonary effort is normal.     Breath sounds: Normal breath sounds.  Musculoskeletal:     Cervical back: Neck supple.  Skin:    General: Skin is warm and dry.  Neurological:     Mental Status: She is alert and oriented to person, place, and time.  Psychiatric:        Mood and Affect: Mood normal.     Physical Exam        Assessment & Plan:  Acute non-recurrent frontal sinusitis Assessment & Plan: Most likely diagnosis given HPI. Other differentials include trigeminal neuralgia, migraine.  Continue amoxicillin  as prescribed. Start prednisone  20 mg tablets. Take 2 tablets by mouth once daily in the morning for 5 days. Hold Flonase for now.  Consider imaging if no improvement.  Orders: -     predniSONE ; Take 2 tablets by mouth once daily in the morning for 5 days.  Dispense: 10 tablet; Refill: 0    Assessment and Plan Assessment & Plan         Comer MARLA Gaskins, NP    History of Present Illness

## 2024-08-22 NOTE — Patient Instructions (Signed)
 Start prednisone  20 mg tablets. Take 2 tablets by mouth once daily in the morning for 5 days.  Hold your Flonase for now.  Continue the Amoxicillin .   Update me if no better!  It was a pleasure to see you today!

## 2024-08-29 ENCOUNTER — Ambulatory Visit (HOSPITAL_COMMUNITY)
Admission: RE | Admit: 2024-08-29 | Discharge: 2024-08-29 | Disposition: A | Discharge status: Home / Self Care | Source: Ambulatory Visit | Attending: Obstetrics & Gynecology | Admitting: Obstetrics & Gynecology

## 2024-08-29 DIAGNOSIS — R1909 Other intra-abdominal and pelvic swelling, mass and lump: Secondary | ICD-10-CM | POA: Insufficient documentation

## 2024-09-03 ENCOUNTER — Other Ambulatory Visit: Payer: Self-pay | Admitting: Adult Health

## 2024-09-03 DIAGNOSIS — F411 Generalized anxiety disorder: Secondary | ICD-10-CM

## 2024-09-03 DIAGNOSIS — F331 Major depressive disorder, recurrent, moderate: Secondary | ICD-10-CM

## 2024-09-22 ENCOUNTER — Telehealth: Admitting: Adult Health

## 2024-09-22 ENCOUNTER — Encounter: Payer: Self-pay | Admitting: Adult Health

## 2024-09-22 DIAGNOSIS — F422 Mixed obsessional thoughts and acts: Secondary | ICD-10-CM | POA: Diagnosis not present

## 2024-09-22 DIAGNOSIS — F411 Generalized anxiety disorder: Secondary | ICD-10-CM

## 2024-09-22 DIAGNOSIS — F331 Major depressive disorder, recurrent, moderate: Secondary | ICD-10-CM | POA: Diagnosis not present

## 2024-09-22 DIAGNOSIS — F909 Attention-deficit hyperactivity disorder, unspecified type: Secondary | ICD-10-CM | POA: Diagnosis not present

## 2024-09-22 MED ORDER — VENLAFAXINE HCL ER 150 MG PO CP24: ORAL_CAPSULE | ORAL | 2 refills | Status: AC

## 2024-09-22 MED ORDER — AMPHETAMINE-DEXTROAMPHET ER 30 MG PO CP24: 30.0000 mg | ORAL_CAPSULE | Freq: Every day | ORAL | 0 refills | Status: AC

## 2024-09-22 MED ORDER — ALPRAZOLAM 0.25 MG PO TABS: 0.2500 mg | ORAL_TABLET | Freq: Two times a day (BID) | ORAL | 2 refills | Status: AC | PRN

## 2024-09-22 MED ORDER — BUSPIRONE HCL 30 MG PO TABS: 30.0000 mg | ORAL_TABLET | Freq: Every day | ORAL | 2 refills | Status: AC

## 2024-09-22 NOTE — Progress Notes (Signed)
 Natalie [name] [id number] 06-12-1986 38 y.o.  Virtual Visit via Video Note  I connected with pt @ on 09/22/2024 at  1:00 PM EST by a video enabled telemedicine application and verified that I am speaking with the correct person using two identifiers.   I discussed the limitations of evaluation and management by telemedicine and the availability of in person appointments. The patient expressed understanding and agreed to proceed.  I discussed the assessment and treatment plan with the patient. The patient was provided an opportunity to ask questions and all were answered. The patient agreed with the plan and demonstrated an understanding of the instructions.   The patient was advised to call back or seek an in-person evaluation if the symptoms worsen or if the condition fails to improve as anticipated.  I provided 25 minutes of non-face-to-face time during this encounter.  The patient was located at home.  The provider was located at [facility].   Natalie [name], Natalie [name]   Subjective:   [name] is a 38 y.o. (DOB 06/23/1986) female.  Chief Complaint: No chief complaint on file.   HPI Natalie [name] presents for follow-up of MDD, GAD, mixed obsessional thoughts and acts and ADHD.  Describes mood today as not too good. Pleasant. Reports increased tearfulness. Mood symptoms - reports increased depression, anxiety and irritability - more situational - health related. Reports lower interest and motivation. Reports having a panic attacks this morning. Reports worry, rumination and over thinking. Reports some skin picking - it's not as bad. Reports mood is blah, not myself. Stating I feel really stressed right now. Taking medications as prescribed. Working with Donzell Rend - therapist.  Energy levels lower feeling exhausted.  Active, does not have a regular exercise routine.   Enjoys some usual interests and activities. Married. Lives with husband and son. Family  local. Appetite adequate. Weight stable - 170 pounds. Reports sleep is variable - hit or miss. Sleeping too little and too much. Focus and concentration stable with Adderall . Completing tasks. Managing aspects of household. 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 Denies SI or HI.  Denies AH or VH. Denies self harm. Denies substance use.  Previous medication trials: Effexor  37.5mg      Review of Systems:  Review of Systems  Musculoskeletal:  Negative for gait problem.  Neurological:  Negative for tremors.  Psychiatric/Behavioral:         Please refer to HPI    Medications: I have reviewed the patient's current medications.  Current Outpatient Medications  Medication Sig Dispense Refill   ALPRAZolam  (XANAX ) 0.25 MG tablet Take 1 tablet (0.25 mg total) by mouth 2 (two) times daily as needed for anxiety. 60 tablet 2   amphetamine -dextroamphetamine  (ADDERALL  XR) 30 MG 24 hr capsule Take 1 capsule (30 mg total) by mouth daily. 30 capsule 0   [START ON 10/20/2024] amphetamine -dextroamphetamine  (ADDERALL  XR) 30 MG 24 hr capsule Take 1 capsule (30 mg total) by mouth daily. 30 capsule 0   [START ON 11/17/2024] amphetamine -dextroamphetamine  (ADDERALL  XR) 30 MG 24 hr capsule Take 1 capsule (30 mg total) by mouth daily. 30 capsule 0   busPIRone  (BUSPAR ) 30 MG tablet Take 1 tablet (30 mg total) by mouth daily. 30 tablet 2   levonorgestrel (MIRENA) 20 MCG/24HR IUD by Intrauterine route.     predniSONE  (DELTASONE ) 20 MG tablet Take 2 tablets by mouth once daily in the morning for 5 days. 10 tablet 0   venlafaxine  XR (EFFEXOR -XR) 150  MG 24 hr capsule TAKE ONE CAPSULE BY MOUTH ONE TIME DAILY WITH BREAKFAST 30 capsule 2   No current facility-administered medications for this visit.    Medication Side Effects: None  Allergies: Allergies[1]  Past Medical History:  Diagnosis Date   Chronic back pain    History of miscarriage 09/17/2014    Family History  Problem Relation Age of Onset   Cancer  Maternal Aunt        breast   Hypertension Paternal Grandmother    Diabetes Paternal Grandfather     Social History   Socioeconomic History   Marital status: Married    Spouse name: Not on file   Number of children: Not on file   Years of education: Not on file   Highest education level: Not on file  Occupational History   Not on file  Tobacco Use   Smoking status: Never   Smokeless tobacco: Never  Substance and Sexual Activity   Alcohol use: Yes    Comment: occassionally   Drug use: No   Sexual activity: Not on file  Other Topics Concern   Not on file  Social History Narrative   Married.   1 child.   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   Social Drivers of Health   Tobacco Use: Low Risk (09/22/2024)   Patient History    Smoking Tobacco Use: Never    Smokeless Tobacco Use: Never    Passive Exposure: Not on file  Financial Resource Strain: Not on file  Food Insecurity: Not on file  Transportation Needs: Not on file  Physical Activity: Not on file  Stress: Not on file  Social Connections: Not on file  Intimate Partner Violence: Not on file  Depression (PHQ2-9): High Risk (08/22/2024)   Depression (PHQ2-9)    PHQ-2 Score: 15  Alcohol Screen: Not on file  Housing: Not on file  Utilities: Not on file  Health Literacy: Not on file    Past Medical History, Surgical history, Social history, and Family history were reviewed and updated as appropriate.   Please see review of systems for further details on the patient's review from today.   Objective:   Physical Exam:  There were no vitals taken for this visit.  Physical Exam Constitutional:      General: She is not in acute distress. Musculoskeletal:        General: No deformity.  Neurological:     Mental Status: She is alert and oriented to person, place, and time.     Coordination: Coordination normal.  Psychiatric:        Attention and Perception:  Attention and perception normal. She does not perceive auditory or visual hallucinations.        Mood and Affect: Mood normal. Mood is not anxious or depressed. Affect is not labile, blunt, angry or inappropriate.        Speech: Speech normal.        Behavior: Behavior normal.        Thought Content: Thought content normal. Thought content is not paranoid or delusional. Thought content does not include homicidal or suicidal ideation. Thought content does not include homicidal or suicidal plan.        Cognition and Memory: Cognition and memory normal.        Judgment: Judgment normal.     Comments: Insight intact     Lab Review:     Component Value Date/Time  NA 142 05/18/2024 0914   K 4.3 05/18/2024 0914   CL 104 05/18/2024 0914   CO2 28 05/18/2024 0914   GLUCOSE 86 05/18/2024 0914   BUN 15 05/18/2024 0914   CREATININE 0.69 05/18/2024 0914   CREATININE 0.85 07/04/2021 1605   CALCIUM 9.1 05/18/2024 0914   PROT 6.7 05/18/2024 0914   ALBUMIN 4.2 05/18/2024 0914   AST 27 05/18/2024 0914   ALT 31 05/18/2024 0914   ALKPHOS 65 05/18/2024 0914   BILITOT 0.4 05/18/2024 0914   GFRNONAA >60 05/25/2022 1848       Component Value Date/Time   WBC 6.1 06/28/2024 1616   RBC 4.12 06/28/2024 1616   HGB 12.7 06/28/2024 1616   HCT 37.4 06/28/2024 1616   PLT 247.0 06/28/2024 1616   MCV 90.7 06/28/2024 1616   MCH 31.0 05/25/2022 1848   MCHC 33.9 06/28/2024 1616   RDW 13.2 06/28/2024 1616   LYMPHSABS 1.9 06/28/2024 1616   MONOABS 0.5 06/28/2024 1616   EOSABS 0.1 06/28/2024 1616   BASOSABS 0.0 06/28/2024 1616    No results found for: POCLITH, LITHIUM   No results found for: PHENYTOIN, PHENOBARB, VALPROATE, CBMZ   .res Assessment: Plan:   Plan:  PDMP reviewed  Xanax  0.25mg  BID Effexor  XR 150mg  daily  Adderall  XR 30mg  every morning. Buspar  30mg  daily for anxiety  RTC 3 months  Monitor BP between visits while taking stimulant medication.   25 minutes spent  dedicated to the care of this patient on the date of this encounter to include pre-visit review of records, ordering of medication, post visit documentation, and face-to-face time with the patient discussing MDD, GAD, mixed obsessional thoughts and acts and ADHD. Discussed continuing current medication regimen.  Patient advised to contact office with any questions, adverse effects, or acute worsening in signs and symptoms.  Discussed potential benefits, risks, and side effects of stimulants with patient to include increased heart rate, palpitations, insomnia, increased anxiety, increased irritability, or decreased appetite. Instructed patient to contact office if experiencing any significant tolerability issues.  Diagnoses and all orders for this visit:  Major depressive disorder, recurrent episode, moderate (HCC) -     venlafaxine  XR (EFFEXOR -XR) 150 MG 24 hr capsule; TAKE ONE CAPSULE BY MOUTH ONE TIME DAILY WITH BREAKFAST  Generalized anxiety disorder -     busPIRone  (BUSPAR ) 30 MG tablet; Take 1 tablet (30 mg total) by mouth daily. -     ALPRAZolam  (XANAX ) 0.25 MG tablet; Take 1 tablet (0.25 mg total) by mouth 2 (two) times daily as needed for anxiety.  Attention deficit hyperactivity disorder (ADHD), unspecified ADHD type -     amphetamine -dextroamphetamine  (ADDERALL  XR) 30 MG 24 hr capsule; Take 1 capsule (30 mg total) by mouth daily. -     amphetamine -dextroamphetamine  (ADDERALL  XR) 30 MG 24 hr capsule; Take 1 capsule (30 mg total) by mouth daily. -     amphetamine -dextroamphetamine  (ADDERALL  XR) 30 MG 24 hr capsule; Take 1 capsule (30 mg total) by mouth daily.  Mixed obsessional thoughts and acts     Please see After Visit Summary for patient specific instructions.  No future appointments.  No orders of the defined types were placed in this encounter.     -------------------------------     [1]  Allergies Allergen Reactions   Pineapple Nausea And Vomiting

## 2024-09-25 ENCOUNTER — Encounter: Payer: Self-pay | Admitting: Radiology

## 2024-09-25 ENCOUNTER — Other Ambulatory Visit: Payer: Self-pay | Admitting: Family Medicine

## 2024-09-25 DIAGNOSIS — R591 Generalized enlarged lymph nodes: Secondary | ICD-10-CM

## 2024-09-25 NOTE — Progress Notes (Signed)
 Karalee Wilkie POUR, MD  Daralene Ferol FALCON, RT Approved for US  guided CORE BX of palpable LEFT INGUINAL LN  - See on US  08/29/24  - No Sedation  HKM       Previous Messages    ----- Message ----- From: Daralene Ferol FALCON, RT Sent: 09/25/2024   1:41 PM EST To: Ir Procedure Requests Subject: US  CORE BIOPSY (LYMPH NODES)                  Procedure :US  CORE BIOPSY (LYMPH NODES)  Reason :Left Inguinal Lymph Nodes Dx: Generalized enlarged lymph nodes [R59.1 (ICD-10-CM)]  History :US  LT LOWER EXTREM LTD SOFT TISSUE NON VASCULAR (Accession [id number]) (Order [id number])  Provider:Megan Morris D.O.  Provider contact ; [phone]

## 2024-10-02 ENCOUNTER — Telehealth: Payer: Self-pay

## 2024-10-02 NOTE — Telephone Encounter (Signed)
 Patient for US  guided core LT inguinal LN Biopsy on Tues 10/03/24, I called and spoke with the patient on the phone and gave pre-procedure instructions. Pt was made aware to be here at 12:30p and check in at the [facility] registration desk. Pt stated understanding.  Called 10/02/24

## 2024-10-03 ENCOUNTER — Ambulatory Visit
Admission: RE | Admit: 2024-10-03 | Discharge: 2024-10-03 | Disposition: A | Discharge status: Home / Self Care | Source: Ambulatory Visit | Attending: Family Medicine | Admitting: Family Medicine

## 2024-10-03 DIAGNOSIS — R591 Generalized enlarged lymph nodes: Secondary | ICD-10-CM

## 2024-10-03 DIAGNOSIS — R590 Localized enlarged lymph nodes: Secondary | ICD-10-CM | POA: Insufficient documentation

## 2024-10-03 MED ORDER — LIDOCAINE HCL (PF) 1 % IJ SOLN
10.0000 mL | Freq: Once | INTRAMUSCULAR | Status: AC
  Administered 2024-10-03: 10 mL

## 2024-10-03 NOTE — Procedures (Signed)
 Interventional Radiology Procedure Note  Procedure: US  CORE BX LEFT INGUINAL NODE    Complications: None  Estimated Blood Loss:  0  Findings: 18G CORES ON SALINE TELFA    EMERSON FREDERIC SPECKING, MD

## 2024-10-06 LAB — SURGICAL PATHOLOGY

## 2024-10-13 LAB — SURGICAL PATHOLOGY

## 2024-11-17 ENCOUNTER — Other Ambulatory Visit: Payer: Self-pay | Admitting: Surgery

## 2024-11-17 DIAGNOSIS — R599 Enlarged lymph nodes, unspecified: Secondary | ICD-10-CM
